# Patient Record
Sex: Female | Born: 1965
Health system: Southern US, Community
[De-identification: ages and names within clinical notes are randomized; demographics above are authoritative.]

## PROBLEM LIST (undated history)

## (undated) DIAGNOSIS — I10 Essential (primary) hypertension: Secondary | ICD-10-CM

## (undated) HISTORY — PX: TUBAL LIGATION: SHX77

## (undated) HISTORY — DX: Essential (primary) hypertension: I10

---

## 1985-01-20 HISTORY — PX: FRACTURE SURGERY: SHX138

## 1992-02-21 LAB — HM COLONOSCOPY: HM Colonoscopy: NORMAL

## 1993-01-20 HISTORY — PX: EYE SURGERY: SHX253

## 1996-01-21 HISTORY — PX: APPENDECTOMY: SHX54

## 2009-10-04 ENCOUNTER — Ambulatory Visit: Payer: Self-pay | Admitting: Internal Medicine

## 2009-10-04 ENCOUNTER — Telehealth: Payer: Self-pay | Admitting: Internal Medicine

## 2009-10-04 DIAGNOSIS — R634 Abnormal weight loss: Secondary | ICD-10-CM | POA: Insufficient documentation

## 2009-10-04 DIAGNOSIS — N760 Acute vaginitis: Secondary | ICD-10-CM | POA: Insufficient documentation

## 2009-10-04 DIAGNOSIS — B9689 Other specified bacterial agents as the cause of diseases classified elsewhere: Secondary | ICD-10-CM | POA: Insufficient documentation

## 2009-10-04 HISTORY — DX: Acute vaginitis: N76.0

## 2009-10-04 HISTORY — DX: Abnormal weight loss: R63.4

## 2009-10-04 HISTORY — DX: Other specified bacterial agents as the cause of diseases classified elsewhere: B96.89

## 2009-10-04 LAB — CONVERTED CEMR LAB
Alkaline Phosphatase: 63 units/L (ref 39–117)
BUN: 10 mg/dL (ref 6–23)
CO2: 27 meq/L (ref 19–32)
Chloride: 107 meq/L (ref 96–112)
Creatinine, Ser: 1.11 mg/dL (ref 0.40–1.20)
HCT: 40.7 % (ref 36.0–46.0)
Hemoglobin: 13.3 g/dL (ref 12.0–15.0)
Indirect Bilirubin: 0.6 mg/dL (ref 0.0–0.9)
LDL Cholesterol: 88 mg/dL (ref 0–99)
RDW: 13.8 % (ref 11.5–15.5)
Total Bilirubin: 0.8 mg/dL (ref 0.3–1.2)
Triglycerides: 46 mg/dL (ref <150)

## 2009-10-09 ENCOUNTER — Telehealth: Payer: Self-pay | Admitting: Internal Medicine

## 2009-10-09 ENCOUNTER — Encounter: Payer: Self-pay | Admitting: Internal Medicine

## 2009-10-16 LAB — CONVERTED CEMR LAB: Pap Smear: NORMAL

## 2009-10-17 ENCOUNTER — Ambulatory Visit: Payer: Self-pay | Admitting: Diagnostic Radiology

## 2009-10-17 ENCOUNTER — Ambulatory Visit (HOSPITAL_BASED_OUTPATIENT_CLINIC_OR_DEPARTMENT_OTHER): Admission: RE | Admit: 2009-10-17 | Discharge: 2009-10-17 | Payer: Self-pay | Admitting: Internal Medicine

## 2009-10-20 LAB — HM PAP SMEAR: HM Pap smear: NORMAL

## 2009-10-23 ENCOUNTER — Telehealth: Payer: Self-pay | Admitting: Internal Medicine

## 2009-11-01 ENCOUNTER — Encounter: Admission: RE | Admit: 2009-11-01 | Discharge: 2009-11-01 | Payer: Self-pay | Admitting: Internal Medicine

## 2009-11-01 LAB — HM MAMMOGRAPHY

## 2009-12-04 ENCOUNTER — Ambulatory Visit: Payer: Self-pay | Admitting: Internal Medicine

## 2010-02-19 NOTE — Letter (Signed)
   [place] at [facility] [address] [place], [place]  [address]  [place] Phone: [phone]      October 09, 2009   [facility] [address] [place], [place] [address]  RE:  LAB RESULTS  Dear  Ms. Tomassi,  The following is an interpretation of your most recent lab tests.  Please take note of any instructions provided or changes to medications that have resulted from your lab work.  ELECTROLYTES:  Good - no changes needed  KIDNEY FUNCTION TESTS:  Good - no changes needed  LIVER FUNCTION TESTS:  Good - no changes needed  LIPID PANEL:  Good - no changes needed Triglyceride: 46   Cholesterol: 159   LDL: 88   HDL: 62   Chol/HDL%:  2.6 Ratio  THYROID STUDIES:  Thyroid studies normal TSH: 1.963     CBC:  Good - no changes needed       Sincerely Yours,    Dr. Thomos Lemons  Appended Document:  mailed

## 2010-02-19 NOTE — Progress Notes (Signed)
Summary: Lab Results  Phone Note Outgoing Call   Summary of Call: call pt - HIV antibody test is negative.  I will send letter re:  other lab results Initial call taken by: D. Thomos Lemons DO,  October 09, 2009 5:34 PM  Follow-up for Phone Call        call placed to patient at [phone], she hsa been informed per Dr Artist Pais instructions Follow-up by: Glendell Docker CMA,  October 10, 2009 12:29 PM

## 2010-02-19 NOTE — Progress Notes (Signed)
Summary: record request faxed to Dr Pete Glatter   Phone Note Outgoing Call   Call placed by: Dr Pete Glatter [place]   Call placed to: yoo  Summary of Call: faxed record request to Dr Pete Glatter in [facility]    Initial call taken by: Roselle Locus,  October 04, 2009 2:21 PM

## 2010-02-19 NOTE — Progress Notes (Signed)
Summary: Follow up on Mammogram  Phone Note Outgoing Call   Summary of Call: plz make sure pt received informationa and follow up instruction re:  recent mammogram Initial call taken by: D. Thomos Lemons DO,  October 23, 2009 1:11 PM  Follow-up for Phone Call        call placed to patient at [phone], no answer, voice recording reached stated mailbox is full. Please try call again later Follow-up by: Glendell Docker CMA,  October 23, 2009 1:55 PM  Additional Follow-up for Phone Call Additional follow up Details #1::        patient returned phone call after she saw there was a missed call. she states she has been informed and had a follow up mammogram done  yesterday.  Additional Follow-up by: Glendell Docker CMA,  October 23, 2009 2:00 PM
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
## 2010-09-11 ENCOUNTER — Encounter: Payer: Self-pay | Admitting: Family

## 2010-09-13 ENCOUNTER — Other Ambulatory Visit: Payer: Self-pay | Admitting: Family

## 2010-09-13 ENCOUNTER — Encounter: Payer: Self-pay | Admitting: Family

## 2010-09-13 ENCOUNTER — Ambulatory Visit (INDEPENDENT_AMBULATORY_CARE_PROVIDER_SITE_OTHER): Payer: Managed Care, Other (non HMO) | Admitting: Family

## 2010-09-13 DIAGNOSIS — Z1231 Encounter for screening mammogram for malignant neoplasm of breast: Secondary | ICD-10-CM

## 2010-09-13 DIAGNOSIS — G43909 Migraine, unspecified, not intractable, without status migrainosus: Secondary | ICD-10-CM

## 2010-09-13 DIAGNOSIS — Z0000 Encounter for general adult medical examination without abnormal findings: Secondary | ICD-10-CM | POA: Insufficient documentation

## 2010-09-13 HISTORY — DX: Migraine, unspecified, not intractable, without status migrainosus: G43.909

## 2010-09-13 LAB — CBC WITH DIFFERENTIAL/PLATELET
Basophils Absolute: 0.1 10*3/uL (ref 0.0–0.1)
Basophils Relative: 1 % (ref 0–1)
MCHC: 33.2 g/dL (ref 30.0–36.0)
Neutro Abs: 3 10*3/uL (ref 1.7–7.7)
Neutrophils Relative %: 57 % (ref 43–77)
RDW: 14 % (ref 11.5–15.5)

## 2010-09-13 LAB — HEPATIC FUNCTION PANEL
ALT: 11 U/L (ref 0–35)
Albumin: 4.1 g/dL (ref 3.5–5.2)
Bilirubin, Direct: 0.2 mg/dL (ref 0.0–0.3)
Total Bilirubin: 0.7 mg/dL (ref 0.3–1.2)

## 2010-09-13 LAB — LIPID PANEL
Cholesterol: 151 mg/dL (ref 0–200)
Triglycerides: 35 mg/dL (ref <150)
VLDL: 7 mg/dL (ref 0–40)

## 2010-09-13 MED ORDER — SUMATRIPTAN SUCCINATE 50 MG PO TABS: ORAL_TABLET | ORAL | Status: DC

## 2010-09-13 NOTE — Assessment & Plan Note (Signed)
Deteriorated. Trial of Imitrex PRN.

## 2010-09-13 NOTE — Patient Instructions (Signed)
Please follow up in 2 months for your Mammogram/Pap smear. Try to eat regular healthy meals.

## 2010-09-13 NOTE — Assessment & Plan Note (Signed)
Pt was counseled on the importance of regular healthy meals.  Immunizations reviewed and up to date.  Pt wishes to have Pap and mammogram completed here.  She will follow up in October.  Counseled on SBE.
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
## 2010-09-14 LAB — BASIC METABOLIC PANEL WITH GFR
BUN: 12 mg/dL (ref 6–23)
Calcium: 9.5 mg/dL (ref 8.4–10.5)
GFR, Est African American: >60 mL/min (ref >60)
GFR, Est Non African American: >60 mL/min (ref >60)
Potassium: 4.3 mEq/L (ref 3.5–5.3)
Sodium: 139 mEq/L (ref 135–145)

## 2010-09-16 ENCOUNTER — Encounter: Payer: Self-pay | Admitting: Family

## 2010-11-20 ENCOUNTER — Ambulatory Visit (INDEPENDENT_AMBULATORY_CARE_PROVIDER_SITE_OTHER): Payer: Managed Care, Other (non HMO) | Admitting: Family

## 2010-11-20 ENCOUNTER — Encounter: Payer: Self-pay | Admitting: Family

## 2010-11-20 ENCOUNTER — Other Ambulatory Visit (HOSPITAL_COMMUNITY)
Admission: RE | Admit: 2010-11-20 | Discharge: 2010-11-20 | Disposition: A | Discharge status: Home / Self Care | Payer: Managed Care, Other (non HMO) | Source: Ambulatory Visit | Attending: Family | Admitting: Family

## 2010-11-20 ENCOUNTER — Ambulatory Visit (HOSPITAL_BASED_OUTPATIENT_CLINIC_OR_DEPARTMENT_OTHER)
Admission: RE | Admit: 2010-11-20 | Discharge: 2010-11-20 | Disposition: A | Discharge status: Home / Self Care | Payer: Managed Care, Other (non HMO) | Source: Ambulatory Visit | Attending: Family | Admitting: Family

## 2010-11-20 ENCOUNTER — Ambulatory Visit (INDEPENDENT_AMBULATORY_CARE_PROVIDER_SITE_OTHER)
Admission: RE | Admit: 2010-11-20 | Discharge: 2010-11-20 | Disposition: A | Discharge status: Home / Self Care | Payer: Managed Care, Other (non HMO) | Source: Ambulatory Visit | Attending: Family | Admitting: Family

## 2010-11-20 VITALS — BP 146/86 | HR 72 | Temp 98.1°F | Resp 16 | Ht 64.75 in | Wt 110.0 lb

## 2010-11-20 DIAGNOSIS — M25559 Pain in unspecified hip: Secondary | ICD-10-CM

## 2010-11-20 DIAGNOSIS — M25551 Pain in right hip: Secondary | ICD-10-CM

## 2010-11-20 DIAGNOSIS — N898 Other specified noninflammatory disorders of vagina: Secondary | ICD-10-CM

## 2010-11-20 DIAGNOSIS — Z01419 Encounter for gynecological examination (general) (routine) without abnormal findings: Secondary | ICD-10-CM

## 2010-11-20 DIAGNOSIS — Z1231 Encounter for screening mammogram for malignant neoplasm of breast: Secondary | ICD-10-CM

## 2010-11-20 NOTE — Patient Instructions (Signed)
Please complete your x-ray and mammogram on the first floor.  You may use Motrin 400mg  every 6 hours as needed for hip pain- (use sparingly). We will contact your with your results.
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
## 2010-11-21 ENCOUNTER — Telehealth: Payer: Self-pay | Admitting: Family

## 2010-11-21 LAB — WET PREP BY MOLECULAR PROBE: Gardnerella vaginalis: NEGATIVE

## 2010-11-21 NOTE — Telephone Encounter (Signed)
Please call patient and let her know that her wet prep is normal.  She should contact us if her symptoms worsen or if they do not improve.  Pap smear is still pending.

## 2010-11-22 NOTE — Telephone Encounter (Signed)
Pt.notified

## 2010-11-24 ENCOUNTER — Encounter: Payer: Self-pay | Admitting: Family

## 2010-11-24 DIAGNOSIS — M25551 Pain in right hip: Secondary | ICD-10-CM

## 2010-11-24 DIAGNOSIS — Z01419 Encounter for gynecological examination (general) (routine) without abnormal findings: Secondary | ICD-10-CM | POA: Insufficient documentation

## 2010-11-24 HISTORY — DX: Encounter for gynecological examination (general) (routine) without abnormal findings: Z01.419

## 2010-11-24 HISTORY — DX: Pain in right hip: M25.551

## 2010-11-24 NOTE — Assessment & Plan Note (Signed)
R hip normal on plain film. There is incidental note of a benign appearing lesion left hip.

## 2010-11-24 NOTE — Assessment & Plan Note (Addendum)
45 yr old female presents today for pap smear.  I recommended that she start a calcium supplement along with weight bearing exercise for bone health.  Mammogram today. Wet prep was also performed today and was negative.

## 2010-11-27 ENCOUNTER — Telehealth: Payer: Self-pay | Admitting: Family

## 2010-12-02 NOTE — Telephone Encounter (Signed)
Opened in error

## 2011-02-14 ENCOUNTER — Encounter: Payer: Self-pay | Admitting: Family

## 2011-02-14 ENCOUNTER — Ambulatory Visit (INDEPENDENT_AMBULATORY_CARE_PROVIDER_SITE_OTHER): Payer: Managed Care, Other (non HMO) | Admitting: Family

## 2011-02-14 DIAGNOSIS — N898 Other specified noninflammatory disorders of vagina: Secondary | ICD-10-CM | POA: Insufficient documentation

## 2011-02-14 DIAGNOSIS — R1900 Intra-abdominal and pelvic swelling, mass and lump, unspecified site: Secondary | ICD-10-CM

## 2011-02-14 HISTORY — DX: Intra-abdominal and pelvic swelling, mass and lump, unspecified site: R19.00

## 2011-02-14 NOTE — Patient Instructions (Signed)
Start monistat 7 tonight.  This is available OTC.   We will call you with your results.
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
## 2011-02-14 NOTE — Assessment & Plan Note (Signed)
Clinically looks like yeast.  Wet prep, GC/Chlamydia swabs performed today. Start [employer], await lab results.

## 2011-02-14 NOTE — Progress Notes (Signed)
Addended by: Mervin Kung A on: 02/14/2011 01:58 PM   Modules accepted: Orders

## 2011-02-14 NOTE — Assessment & Plan Note (Signed)
Sounds like she has a ventral hernia which reduces itself.  I discussed signs/symptoms of incarcerated hernia and need to go to ED if this occurs. Recommended CT abdomen for further evaluation followed by possible referral to surgeon pending results. She wishes to hold off on any further work up at this time.

## 2011-02-15 LAB — GC/CHLAMYDIA PROBE AMP, GENITAL: Chlamydia, DNA Probe: NEGATIVE

## 2011-02-15 LAB — WET PREP BY MOLECULAR PROBE
Candida species: NEGATIVE
Gardnerella vaginalis: POSITIVE — AB
Trichomonas vaginosis: NEGATIVE

## 2011-02-17 ENCOUNTER — Telehealth: Payer: Self-pay | Admitting: Family

## 2011-02-17 MED ORDER — METRONIDAZOLE 0.75 % VA GEL: 1.0000 | Freq: Two times a day (BID) | VAGINAL | Status: AC

## 2011-02-17 NOTE — Telephone Encounter (Signed)
Attempted to reach pt and left detailed message on voicemail re: instructions below and that other testing is normal. Advised pt to call if any questions.

## 2011-02-17 NOTE — Telephone Encounter (Signed)
Pls call pt and let her know that her wet prep shows bacteria. She should stop monistat, start metrogel.  Let me know if she has any nausea with the medicine, but hopefully she will not since it is more of a topical medicine.  She has had nausea with the tabs in the past.   Gonorrhea/chlamydia testing is negative.

## 2011-04-10 ENCOUNTER — Emergency Department (HOSPITAL_BASED_OUTPATIENT_CLINIC_OR_DEPARTMENT_OTHER)
Admission: EM | Admit: 2011-04-10 | Discharge: 2011-04-10 | Disposition: A | Discharge status: Home / Self Care | Payer: Managed Care, Other (non HMO) | Attending: Emergency Medicine | Admitting: Emergency Medicine

## 2011-04-10 ENCOUNTER — Other Ambulatory Visit: Payer: Self-pay

## 2011-04-10 ENCOUNTER — Telehealth: Payer: Self-pay | Admitting: Family

## 2011-04-10 ENCOUNTER — Emergency Department (INDEPENDENT_AMBULATORY_CARE_PROVIDER_SITE_OTHER): Payer: Managed Care, Other (non HMO)

## 2011-04-10 ENCOUNTER — Encounter (HOSPITAL_BASED_OUTPATIENT_CLINIC_OR_DEPARTMENT_OTHER): Payer: Self-pay | Admitting: *Deleted

## 2011-04-10 DIAGNOSIS — R079 Chest pain, unspecified: Secondary | ICD-10-CM

## 2011-04-10 DIAGNOSIS — R0789 Other chest pain: Secondary | ICD-10-CM | POA: Insufficient documentation

## 2011-04-10 LAB — TROPONIN I: Troponin I: <0.3 ng/mL (ref <0.30)

## 2011-04-10 MED ORDER — IBUPROFEN 800 MG PO TABS
800.0000 mg | ORAL_TABLET | Freq: Once | ORAL | Status: AC
  Administered 2011-04-10: 800 mg via ORAL
  Filled 2011-04-10: qty 1

## 2011-04-10 NOTE — Discharge Instructions (Signed)
Chest Pain, Nonspecific It is often hard to give a specific diagnosis for the cause of chest pain. There is always a chance that your pain could be related to something serious, like a heart attack or a blood clot in the lungs. You need to follow up with your caregiver for further evaluation. More lab tests or other studies such as X-rays, electrocardiography, stress testing, or cardiac imaging may be needed to find the cause of your pain. Most of the time, nonspecific chest pain improves within 2 to 3 days with rest and mild pain medicine. For the next few days, avoid physical exertion or activities that bring on pain. Do not smoke. Avoid drinking alcohol. Call your caregiver for routine follow-up as advised.  SEEK IMMEDIATE MEDICAL CARE IF:  You develop increased chest pain or pain that radiates to the arm, neck, jaw, back, or abdomen.   You develop shortness of breath, increased coughing, or you start coughing up blood.   You have severe back or abdominal pain, nausea, or vomiting.   You develop severe weakness, fainting, fever, or chills.  Document Released: 01/06/2005 Document Revised: 12/26/2010 Document Reviewed: 06/26/2006 [facility] Patient Information 2012 [place], [place].    Take Advil as directed for pain. Call [facility] Craze to be seen if not improved by next week . Return if your condition worsens for any reason

## 2011-04-10 NOTE — Telephone Encounter (Signed)
Patient called with Shortness of Breath, and left  breast pain,  Trisha @ lunch ,told patient if she thought she should go to ED ,she could without waiting to call back . Patient stated she was going.

## 2011-04-10 NOTE — ED Notes (Signed)
EKG completed and given to Dr. Lockwood. 
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
## 2011-04-10 NOTE — ED Notes (Signed)
Pt reports chest pain since last night- states she had been lifting boxes at work 3 days ago- reports pain worse with inspiration and movement

## 2011-04-11 NOTE — Telephone Encounter (Signed)
Spoke with pt re: ER visit yesterday. She reports that she was instructed to use OTC anti-inflammatories as she might have a pulled muscle and to follow up with [place] in 1 week. Pt states she feels some better today. Follow up scheduled for 04/16/11 at 2:30pm.

## 2011-04-11 NOTE — Telephone Encounter (Signed)
Attempted to reach pt to see how she is doing today. Left message for pt to return my call.

## 2011-04-16 ENCOUNTER — Ambulatory Visit: Payer: Managed Care, Other (non HMO) | Admitting: Family

## 2011-04-16 DIAGNOSIS — Z0289 Encounter for other administrative examinations: Secondary | ICD-10-CM

## 2011-05-30 ENCOUNTER — Encounter: Payer: Self-pay | Admitting: Family

## 2011-05-30 ENCOUNTER — Telehealth: Payer: Self-pay | Admitting: Internal Medicine

## 2011-05-30 ENCOUNTER — Ambulatory Visit (INDEPENDENT_AMBULATORY_CARE_PROVIDER_SITE_OTHER): Payer: Managed Care, Other (non HMO) | Admitting: Family

## 2011-05-30 VITALS — BP 110/80 | HR 81 | Temp 98.1°F | Resp 16 | Wt 112.1 lb

## 2011-05-30 DIAGNOSIS — N898 Other specified noninflammatory disorders of vagina: Secondary | ICD-10-CM

## 2011-05-30 DIAGNOSIS — N760 Acute vaginitis: Secondary | ICD-10-CM

## 2011-05-30 MED ORDER — CLINDAMYCIN HCL 300 MG PO CAPS: 300.0000 mg | ORAL_CAPSULE | Freq: Two times a day (BID) | ORAL | Status: AC

## 2011-05-30 NOTE — Telephone Encounter (Signed)
Since it has been 3 months since her last visit, I will need to see her back in the office and confirm that it is still BV and not a different type of infection.  We will then plan to treat accordingly.
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
## 2011-05-30 NOTE — Telephone Encounter (Signed)
Patient states that she is still having problems with the bacterial vaginosis?, since her last visit. She would like to talk to [facility] about what else she could do about this problem.

## 2011-05-30 NOTE — Assessment & Plan Note (Signed)
Wet prep performed today. Start empiric rx with clindamycin.

## 2011-05-30 NOTE — Patient Instructions (Signed)
Please call if symptoms worsen or if not resolved in 1 week.  

## 2011-05-30 NOTE — Telephone Encounter (Signed)
Notified pt and scheduled her an appt for today at 2:15pm.

## 2011-05-30 NOTE — Telephone Encounter (Signed)
Please advise 

## 2011-05-31 LAB — WET PREP BY MOLECULAR PROBE
Candida species: NEGATIVE
Gardnerella vaginalis: POSITIVE — AB
Trichomonas vaginosis: NEGATIVE

## 2011-12-10 ENCOUNTER — Encounter: Payer: Self-pay | Admitting: Family

## 2011-12-10 ENCOUNTER — Ambulatory Visit (INDEPENDENT_AMBULATORY_CARE_PROVIDER_SITE_OTHER): Payer: Managed Care, Other (non HMO) | Admitting: Family

## 2011-12-10 VITALS — BP 128/90 | HR 73 | Temp 98.0°F | Resp 16 | Ht 64.75 in | Wt 108.0 lb

## 2011-12-10 DIAGNOSIS — R519 Headache, unspecified: Secondary | ICD-10-CM | POA: Insufficient documentation

## 2011-12-10 DIAGNOSIS — R51 Headache: Secondary | ICD-10-CM

## 2011-12-10 HISTORY — DX: Headache: R51

## 2011-12-10 MED ORDER — KETOROLAC TROMETHAMINE 30 MG/ML IJ SOLN
30.0000 mg | Freq: Once | INTRAMUSCULAR | Status: AC
  Administered 2011-12-10: 30 mg via INTRAMUSCULAR

## 2011-12-10 NOTE — Patient Instructions (Addendum)
You can use imitrex as need for headache. Call if headache is not improved in 24 hours.

## 2011-12-10 NOTE — Assessment & Plan Note (Signed)
IM toradol in the office.  Recommended imitrex as needed and to call if no improvement with these measures.

## 2011-12-10 NOTE — Addendum Note (Signed)
Addended by: Mervin Kung A on: 12/10/2011 04:16 PM   Modules accepted: Orders
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
## 2011-12-17 ENCOUNTER — Other Ambulatory Visit: Payer: Self-pay | Admitting: *Deleted

## 2011-12-17 NOTE — Telephone Encounter (Signed)
Pt left message requesting refill of Clindamycin taken in the past for bacterial vaginosis. Attempted to reach pt and left message to return my call.

## 2011-12-23 NOTE — Telephone Encounter (Signed)
Pt left message requesting refill. Attempted to reach pt and left detailed message to call us back with current symptoms.

## 2011-12-24 MED ORDER — CLINDAMYCIN HCL 300 MG PO CAPS: 300.0000 mg | ORAL_CAPSULE | Freq: Two times a day (BID) | ORAL | Status: DC

## 2011-12-24 NOTE — Telephone Encounter (Signed)
Pt left message stating she is currently having a white vaginal discharge the same as when she had her bacterial infection. Pt states she is out of refills on clindamycin and would like more.  Please advise.

## 2011-12-24 NOTE — Telephone Encounter (Signed)
Clindamycin rx sent to pharmacy.  If no improvement with clindamycin needs OV for wet prep.

## 2011-12-24 NOTE — Telephone Encounter (Signed)
Notified pt and she voices understanding. 

## 2011-12-26 NOTE — Telephone Encounter (Signed)
Received message from pt that pharmacy did not receive our Rx for Clindamycin. Verified with Pam at [facility] pharmacy that rx is ready to be picked up. Attempted to reach pt and left message on voicemail that rx is ready for pick up at the [facility] and to call if any questions.

## 2012-01-01 ENCOUNTER — Telehealth: Payer: Self-pay | Admitting: Family

## 2012-01-01 NOTE — Telephone Encounter (Signed)
REFILL CLINDAMYCIN 300 MG CAP TAKE 1 CAPSULE BY MOUTH TWICE DAILY QTY 14 LAST FILL 12-25-2011

## 2012-01-01 NOTE — Telephone Encounter (Signed)
Spoke with pt, she states this is an old request and she picked up previous rx that we sent in. Does not need medication at this time.

## 2012-02-17 ENCOUNTER — Encounter: Payer: Self-pay | Admitting: Family

## 2012-02-17 ENCOUNTER — Ambulatory Visit (INDEPENDENT_AMBULATORY_CARE_PROVIDER_SITE_OTHER): Payer: Managed Care, Other (non HMO) | Admitting: Family

## 2012-02-17 VITALS — BP 130/82 | HR 68 | Temp 98.5°F | Resp 16 | Wt 108.1 lb

## 2012-02-17 DIAGNOSIS — M25519 Pain in unspecified shoulder: Secondary | ICD-10-CM

## 2012-02-17 DIAGNOSIS — A599 Trichomoniasis, unspecified: Secondary | ICD-10-CM

## 2012-02-17 DIAGNOSIS — A5901 Trichomonal vulvovaginitis: Secondary | ICD-10-CM

## 2012-02-17 DIAGNOSIS — N898 Other specified noninflammatory disorders of vagina: Secondary | ICD-10-CM

## 2012-02-17 DIAGNOSIS — M25511 Pain in right shoulder: Secondary | ICD-10-CM | POA: Insufficient documentation

## 2012-02-17 NOTE — Patient Instructions (Addendum)
Please let us know if shoulder pain worsens or if it does not continue to improve.  We will contact you with the results of your swab.
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
## 2012-02-18 ENCOUNTER — Other Ambulatory Visit: Payer: Self-pay | Admitting: Family

## 2012-02-18 DIAGNOSIS — M25511 Pain in right shoulder: Secondary | ICD-10-CM

## 2012-02-18 DIAGNOSIS — A599 Trichomoniasis, unspecified: Secondary | ICD-10-CM | POA: Insufficient documentation

## 2012-02-18 HISTORY — DX: Pain in right shoulder: M25.511

## 2012-02-18 LAB — WET PREP BY MOLECULAR PROBE
Candida species: NEGATIVE
Gardnerella vaginalis: NEGATIVE
Trichomonas vaginosis: POSITIVE — AB

## 2012-02-18 MED ORDER — TINIDAZOLE 500 MG PO TABS: 2.0000 g | ORAL_TABLET | Freq: Once | ORAL | Status: DC

## 2012-02-18 MED ORDER — ONDANSETRON HCL 4 MG PO TABS: ORAL_TABLET | ORAL | Status: DC

## 2012-02-18 NOTE — Assessment & Plan Note (Signed)
Improving.  Monitor. If symptoms worsen or if no improvement, consider referral to sports medicine.

## 2012-02-18 NOTE — Assessment & Plan Note (Signed)
Wet prep performed- shows trich.  Plan rx with tindamax.  See phone note.

## 2012-02-18 NOTE — Telephone Encounter (Signed)
Spoke with pt.  Discussed + trichomonas.  Recommended that she notify partner and that he be treated as well.  She has taken metronidazole in past- with some nausea.  Will try single dose Tindamax with zofran prior to dose to see if she can tolerated.  She verbalizes understanding.

## 2012-02-18 NOTE — Telephone Encounter (Signed)
Reviewed Wet prep- + trichomonas.

## 2012-04-07 ENCOUNTER — Encounter: Payer: Self-pay | Admitting: Family

## 2012-04-07 ENCOUNTER — Ambulatory Visit (INDEPENDENT_AMBULATORY_CARE_PROVIDER_SITE_OTHER): Payer: Managed Care, Other (non HMO) | Admitting: Family

## 2012-04-07 VITALS — BP 120/84 | HR 79 | Temp 98.2°F | Resp 16 | Wt 109.1 lb

## 2012-04-07 DIAGNOSIS — M25511 Pain in right shoulder: Secondary | ICD-10-CM

## 2012-04-07 DIAGNOSIS — M25519 Pain in unspecified shoulder: Secondary | ICD-10-CM
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
## 2012-04-07 NOTE — Patient Instructions (Addendum)
You will be contact about your referral to sports medicine.  Please let us know if you have not heard back within 1 week about your referral.

## 2012-04-08 NOTE — Assessment & Plan Note (Signed)
Deteriorated. Will refer to sports med for further evaluation.

## 2012-04-09 ENCOUNTER — Ambulatory Visit: Payer: Managed Care, Other (non HMO) | Admitting: Family Medicine

## 2012-04-12 ENCOUNTER — Ambulatory Visit (INDEPENDENT_AMBULATORY_CARE_PROVIDER_SITE_OTHER): Payer: Managed Care, Other (non HMO) | Admitting: Family Medicine

## 2012-04-12 ENCOUNTER — Encounter: Payer: Self-pay | Admitting: Family Medicine

## 2012-04-12 VITALS — BP 161/108 | HR 80 | Ht 66.0 in | Wt 107.0 lb

## 2012-04-12 DIAGNOSIS — M25511 Pain in right shoulder: Secondary | ICD-10-CM

## 2012-04-12 DIAGNOSIS — M25519 Pain in unspecified shoulder: Secondary | ICD-10-CM

## 2012-04-12 MED ORDER — MELOXICAM 15 MG PO TABS: 15.0000 mg | ORAL_TABLET | Freq: Every day | ORAL | Status: DC

## 2012-04-12 NOTE — Patient Instructions (Addendum)
You have rotator cuff impingement Try to avoid painful activities (overhead activities, lifting with extended arm) as much as possible. Meloxicam 15mg  daily with food for pain and inflammation (don't take aleve or ibuprofen with this). Ok to take tylenol in addition to this though if you need something else. Subacromial injection may be beneficial to help with pain and to decrease inflammation - would consider this in the future. Start physical therapy with transition to home exercise program. Do home exercise program with theraband and scapular stabilization exercises daily - these are very important for long term relief. If not improving at follow-up we will consider further imaging, injection, nitro patches. Follow up with me in 6 weeks for reevaluation.

## 2012-04-13 ENCOUNTER — Encounter: Payer: Self-pay | Admitting: Family Medicine
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
## 2012-04-13 NOTE — Assessment & Plan Note (Signed)
2/2 rotator cuff impingement.  Start with formal PT and home exercises.  meloxicam and tylenol as needed for pain.  Declined subacromial injection today.  Consider injection, nitro patches, further imaging if not improving as expected.

## 2012-04-14 ENCOUNTER — Ambulatory Visit: Payer: Managed Care, Other (non HMO) | Attending: Family Medicine | Admitting: Physical Therapy

## 2012-04-14 DIAGNOSIS — IMO0001 Reserved for inherently not codable concepts without codable children: Secondary | ICD-10-CM | POA: Insufficient documentation

## 2012-04-14 DIAGNOSIS — M25519 Pain in unspecified shoulder: Secondary | ICD-10-CM | POA: Insufficient documentation

## 2012-04-14 DIAGNOSIS — M25619 Stiffness of unspecified shoulder, not elsewhere classified: Secondary | ICD-10-CM | POA: Insufficient documentation

## 2012-04-20 ENCOUNTER — Ambulatory Visit: Payer: Managed Care, Other (non HMO) | Attending: Family Medicine | Admitting: Physical Therapy

## 2012-04-20 DIAGNOSIS — M25619 Stiffness of unspecified shoulder, not elsewhere classified: Secondary | ICD-10-CM | POA: Insufficient documentation

## 2012-04-20 DIAGNOSIS — M25519 Pain in unspecified shoulder: Secondary | ICD-10-CM | POA: Insufficient documentation

## 2012-04-20 DIAGNOSIS — IMO0001 Reserved for inherently not codable concepts without codable children: Secondary | ICD-10-CM | POA: Insufficient documentation

## 2012-04-22 ENCOUNTER — Ambulatory Visit: Payer: Managed Care, Other (non HMO) | Admitting: Physical Therapy

## 2012-04-27 ENCOUNTER — Ambulatory Visit: Payer: Managed Care, Other (non HMO) | Admitting: Physical Therapy

## 2012-04-27 ENCOUNTER — Other Ambulatory Visit: Payer: Self-pay | Admitting: Family

## 2012-04-27 DIAGNOSIS — Z1231 Encounter for screening mammogram for malignant neoplasm of breast: Secondary | ICD-10-CM

## 2012-04-28 ENCOUNTER — Ambulatory Visit (HOSPITAL_BASED_OUTPATIENT_CLINIC_OR_DEPARTMENT_OTHER)
Admission: RE | Admit: 2012-04-28 | Discharge: 2012-04-28 | Disposition: A | Discharge status: Home / Self Care | Payer: Managed Care, Other (non HMO) | Source: Ambulatory Visit | Attending: Family | Admitting: Family

## 2012-04-28 DIAGNOSIS — Z1231 Encounter for screening mammogram for malignant neoplasm of breast: Secondary | ICD-10-CM

## 2012-04-29 ENCOUNTER — Ambulatory Visit: Payer: Managed Care, Other (non HMO) | Admitting: Physical Therapy

## 2012-05-04 ENCOUNTER — Ambulatory Visit: Payer: Managed Care, Other (non HMO) | Admitting: Physical Therapy

## 2012-05-05 ENCOUNTER — Telehealth: Payer: Self-pay | Admitting: *Deleted

## 2012-05-05 NOTE — Telephone Encounter (Signed)
Received call from pt requesting recent mammogram results. Advised pt of normal results. Pt states she has a tender cyst on her breast and has had these in the past and usually has to have them drained. Scheduled pt appt on 05/11/12 at 3:15pm.

## 2012-05-06 ENCOUNTER — Ambulatory Visit: Payer: Managed Care, Other (non HMO) | Admitting: Physical Therapy

## 2012-05-11 ENCOUNTER — Ambulatory Visit: Payer: Managed Care, Other (non HMO) | Admitting: Physical Therapy

## 2012-05-11 ENCOUNTER — Ambulatory Visit: Payer: Managed Care, Other (non HMO) | Admitting: Family

## 2012-05-13 ENCOUNTER — Ambulatory Visit (HOSPITAL_BASED_OUTPATIENT_CLINIC_OR_DEPARTMENT_OTHER)
Admission: RE | Admit: 2012-05-13 | Discharge: 2012-05-13 | Disposition: A | Discharge status: Home / Self Care | Payer: Managed Care, Other (non HMO) | Source: Ambulatory Visit | Attending: Family Medicine | Admitting: Family Medicine

## 2012-05-13 ENCOUNTER — Encounter: Payer: Self-pay | Admitting: Family Medicine

## 2012-05-13 ENCOUNTER — Ambulatory Visit: Payer: Managed Care, Other (non HMO) | Admitting: Physical Therapy

## 2012-05-13 ENCOUNTER — Ambulatory Visit (INDEPENDENT_AMBULATORY_CARE_PROVIDER_SITE_OTHER): Payer: Managed Care, Other (non HMO) | Admitting: Family Medicine

## 2012-05-13 VITALS — BP 122/86 | HR 70 | Ht 67.0 in | Wt 110.0 lb

## 2012-05-13 DIAGNOSIS — M25511 Pain in right shoulder: Secondary | ICD-10-CM

## 2012-05-13 DIAGNOSIS — M25519 Pain in unspecified shoulder: Secondary | ICD-10-CM | POA: Insufficient documentation

## 2012-05-13 NOTE — Assessment & Plan Note (Signed)
Not improving as expected over past month with PT, meloxicam.  Radiographs today negative.  Will move forward with MRI to assess for rotator cuff tear.  If only tendinopathy, adhesive capsulitis will consider combination subacromial/glenohumeral injection, patches, HEP.

## 2012-05-13 NOTE — Patient Instructions (Addendum)
Go ahead with x-rays, MRI. We will call you with results and how to proceed.
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
## 2012-05-18 ENCOUNTER — Ambulatory Visit: Payer: Managed Care, Other (non HMO) | Admitting: Rehabilitation

## 2012-05-24 ENCOUNTER — Encounter: Payer: Self-pay | Admitting: Family

## 2012-05-24 ENCOUNTER — Ambulatory Visit: Payer: Managed Care, Other (non HMO) | Admitting: Family Medicine

## 2012-05-24 ENCOUNTER — Ambulatory Visit (INDEPENDENT_AMBULATORY_CARE_PROVIDER_SITE_OTHER): Payer: Managed Care, Other (non HMO) | Admitting: Family

## 2012-05-24 ENCOUNTER — Ambulatory Visit
Admission: RE | Admit: 2012-05-24 | Discharge: 2012-05-24 | Disposition: A | Discharge status: Home / Self Care | Payer: Managed Care, Other (non HMO) | Source: Ambulatory Visit | Attending: Family Medicine | Admitting: Family Medicine

## 2012-05-24 VITALS — BP 116/80 | HR 84 | Temp 98.3°F | Resp 16 | Wt 113.1 lb

## 2012-05-24 DIAGNOSIS — N6310 Unspecified lump in the right breast, unspecified quadrant: Secondary | ICD-10-CM

## 2012-05-24 DIAGNOSIS — N63 Unspecified lump in unspecified breast: Secondary | ICD-10-CM

## 2012-05-24 DIAGNOSIS — M25511 Pain in right shoulder: Secondary | ICD-10-CM

## 2012-05-24 NOTE — Patient Instructions (Addendum)
You will be contacted about your breast imaging.  Please let us know if you do not hear back about your appointment by the end of the week.

## 2012-05-24 NOTE — Progress Notes (Signed)
  Subjective:    Patient ID: Emily Casey, female    DOB: [birth year], 47 y.o.   MRN: [id number]  HPI  Emily Casey is a 47 yr old female who presents today for evaluation of a right breast "Cyst." first noticed 2 months ago. Reports that area is enlarging.  Had a mammogram last month which was normal.  Denies tenderness.   Review of Systems See HPI    Objective:   Physical Exam  Constitutional: She appears well-developed and well-nourished. No distress.  Cardiovascular: Normal rate and regular rhythm.   No murmur heard. Pulmonary/Chest: Effort normal and breath sounds normal. No respiratory distress. She has no wheezes. She has no rales. She exhibits no tenderness.  Genitourinary:  Approximately 2 inch wide mobile subareolar mass, no nipple discharge.  Bilateral breasts are noted to have cystic tissue but no other discrete masses are noted.           Assessment & Plan:

## 2012-05-25 ENCOUNTER — Ambulatory Visit: Payer: Managed Care, Other (non HMO) | Attending: Family Medicine | Admitting: Rehabilitation

## 2012-05-25 DIAGNOSIS — IMO0001 Reserved for inherently not codable concepts without codable children: Secondary | ICD-10-CM | POA: Insufficient documentation

## 2012-05-25 DIAGNOSIS — M25519 Pain in unspecified shoulder: Secondary | ICD-10-CM | POA: Insufficient documentation

## 2012-05-25 DIAGNOSIS — M25619 Stiffness of unspecified shoulder, not elsewhere classified: Secondary | ICD-10-CM | POA: Insufficient documentation

## 2012-05-26 MED ORDER — NITROGLYCERIN 0.2 MG/HR TD PT24: MEDICATED_PATCH | TRANSDERMAL | Status: DC

## 2012-05-26 NOTE — Addendum Note (Signed)
Addended by: Lenda Kelp on: 05/26/2012 04:50 PM   Modules accepted: Orders

## 2012-05-27 DIAGNOSIS — N6310 Unspecified lump in the right breast, unspecified quadrant: Secondary | ICD-10-CM

## 2012-05-27 HISTORY — DX: Unspecified lump in the right breast, unspecified quadrant: N63.10

## 2012-05-27 NOTE — Assessment & Plan Note (Signed)
Mass is large.  Will request diagnostic mammo for further evaluation.

## 2012-06-01 ENCOUNTER — Ambulatory Visit: Payer: Managed Care, Other (non HMO) | Admitting: Rehabilitation

## 2012-06-02 ENCOUNTER — Ambulatory Visit
Admission: RE | Admit: 2012-06-02 | Discharge: 2012-06-02 | Disposition: A | Discharge status: Home / Self Care | Payer: Managed Care, Other (non HMO) | Source: Ambulatory Visit | Attending: Family | Admitting: Family

## 2012-06-02 ENCOUNTER — Other Ambulatory Visit: Payer: Self-pay | Admitting: Family

## 2012-06-02 DIAGNOSIS — N6310 Unspecified lump in the right breast, unspecified quadrant: Secondary | ICD-10-CM

## 2012-06-08 ENCOUNTER — Ambulatory Visit: Payer: Managed Care, Other (non HMO) | Admitting: Physical Therapy

## 2012-06-15 ENCOUNTER — Ambulatory Visit: Payer: Managed Care, Other (non HMO) | Admitting: Rehabilitation

## 2012-06-21 ENCOUNTER — Telehealth: Payer: Self-pay | Admitting: *Deleted

## 2012-06-21 MED ORDER — METRONIDAZOLE 500 MG PO TABS: 500.0000 mg | ORAL_TABLET | Freq: Two times a day (BID) | ORAL | Status: DC

## 2012-06-21 NOTE — Telephone Encounter (Signed)
ER notes received and forwarded to Provider, please advise.

## 2012-06-21 NOTE — Telephone Encounter (Signed)
Received call from pt stating she was seen in the ER over the weekend for abdominal pain. Went to [facility] and reports negative std testing but doctor mentioned seeing some cells indicating bacterial vaginosis but states that he did not prescribe her anything. Pt states vaginal odor is getter stronger and wants to know if we can prescribe something. Advised her we will need to get ER records first. Records requested.

## 2012-06-21 NOTE — Telephone Encounter (Signed)
Records reviewed. rx sent for [facility] to [facility] to treat bacterial vaginosis.

## 2012-06-22 NOTE — Telephone Encounter (Signed)
Called and left message for pt to return call.  

## 2012-06-22 NOTE — Telephone Encounter (Signed)
Notified pt. 

## 2012-07-05 ENCOUNTER — Other Ambulatory Visit: Payer: Self-pay | Admitting: Family Medicine

## 2012-07-05 ENCOUNTER — Telehealth: Payer: Self-pay | Admitting: Family Medicine

## 2012-07-05 ENCOUNTER — Other Ambulatory Visit: Payer: Self-pay | Admitting: *Deleted

## 2012-07-05 DIAGNOSIS — M25511 Pain in right shoulder: Secondary | ICD-10-CM

## 2012-07-05 MED ORDER — MELOXICAM 15 MG PO TABS: 15.0000 mg | ORAL_TABLET | Freq: Every day | ORAL | Status: DC

## 2012-07-06 ENCOUNTER — Other Ambulatory Visit: Payer: Self-pay | Admitting: Family Medicine

## 2012-08-25 NOTE — Telephone Encounter (Signed)
Message opened in error

## 2012-10-15 ENCOUNTER — Ambulatory Visit: Payer: Managed Care, Other (non HMO) | Admitting: Family

## 2012-10-15 DIAGNOSIS — Z0289 Encounter for other administrative examinations: Secondary | ICD-10-CM

## 2012-10-29 ENCOUNTER — Ambulatory Visit (INDEPENDENT_AMBULATORY_CARE_PROVIDER_SITE_OTHER): Payer: Managed Care, Other (non HMO) | Admitting: Family

## 2012-10-29 ENCOUNTER — Encounter: Payer: Self-pay | Admitting: Family

## 2012-10-29 ENCOUNTER — Other Ambulatory Visit (HOSPITAL_COMMUNITY)
Admission: RE | Admit: 2012-10-29 | Discharge: 2012-10-29 | Disposition: A | Discharge status: Home / Self Care | Payer: Managed Care, Other (non HMO) | Source: Ambulatory Visit | Attending: Family | Admitting: Family

## 2012-10-29 VITALS — BP 130/80 | HR 71 | Temp 98.2°F | Resp 16 | Ht 64.75 in | Wt 112.1 lb

## 2012-10-29 DIAGNOSIS — L0291 Cutaneous abscess, unspecified: Secondary | ICD-10-CM | POA: Insufficient documentation

## 2012-10-29 DIAGNOSIS — Z113 Encounter for screening for infections with a predominantly sexual mode of transmission: Secondary | ICD-10-CM | POA: Insufficient documentation

## 2012-10-29 DIAGNOSIS — N760 Acute vaginitis: Secondary | ICD-10-CM

## 2012-10-29 DIAGNOSIS — Z01419 Encounter for gynecological examination (general) (routine) without abnormal findings: Secondary | ICD-10-CM | POA: Insufficient documentation

## 2012-10-29 DIAGNOSIS — Z1151 Encounter for screening for human papillomavirus (HPV): Secondary | ICD-10-CM | POA: Insufficient documentation

## 2012-10-29 DIAGNOSIS — N898 Other specified noninflammatory disorders of vagina: Secondary | ICD-10-CM

## 2012-10-29 MED ORDER — DOXYCYCLINE HYCLATE 100 MG PO TABS: 100.0000 mg | ORAL_TABLET | Freq: Two times a day (BID) | ORAL | Status: DC

## 2012-10-29 NOTE — Assessment & Plan Note (Signed)
Wet prep, GC/Chlamydia, Pap performed today. Will plan for additional serum testing HIV, RPR, HSV in 1 week at her follow up per pt request.

## 2012-10-29 NOTE — Patient Instructions (Signed)
Start doxycycline- antibiotic for the boil. This has been sent to your pharmacy. Take warm tub soaks or apply warm compresses to the boil twice daily. Call if boil increases in size or if it does not continue to improve.  We will let you know how your vaginal swabs turn out. Follow up in 1 week.
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
## 2012-10-29 NOTE — Assessment & Plan Note (Signed)
Will rx with doxycycline.  Likely started from a folliculitis. Per pt this is improving.  No indication for I and D at this time.  Recommended warm compresses and warm tub soaks to promote drainage.  Pt instructed to call if increased pain, swelling, or if symptoms to not continue to improve.

## 2012-10-30 LAB — WET PREP BY MOLECULAR PROBE: Gardnerella vaginalis: POSITIVE — AB

## 2012-11-01 ENCOUNTER — Telehealth: Payer: Self-pay | Admitting: Family

## 2012-11-01 MED ORDER — METRONIDAZOLE 0.75 % VA GEL: 1.0000 | Freq: Two times a day (BID) | VAGINAL | Status: DC

## 2012-11-01 NOTE — Telephone Encounter (Signed)
Please call pt and let her know that wet prep shows BV. Rx sent for metrogel to her pharmacy.

## 2012-11-01 NOTE — Telephone Encounter (Signed)
Notified pt and she voices understanding. 

## 2014-01-30 ENCOUNTER — Ambulatory Visit: Payer: Managed Care, Other (non HMO) | Admitting: Family

## 2014-02-03 ENCOUNTER — Ambulatory Visit (INDEPENDENT_AMBULATORY_CARE_PROVIDER_SITE_OTHER): Payer: Managed Care, Other (non HMO) | Admitting: Family

## 2014-02-03 ENCOUNTER — Encounter: Payer: Self-pay | Admitting: Family

## 2014-02-03 ENCOUNTER — Other Ambulatory Visit (HOSPITAL_COMMUNITY)
Admission: RE | Admit: 2014-02-03 | Discharge: 2014-02-03 | Disposition: A | Discharge status: Home / Self Care | Payer: Managed Care, Other (non HMO) | Source: Ambulatory Visit | Attending: Family | Admitting: Family

## 2014-02-03 VITALS — BP 120/70 | HR 71 | Temp 98.4°F | Resp 16 | Ht 64.75 in | Wt 111.4 lb

## 2014-02-03 DIAGNOSIS — N760 Acute vaginitis: Secondary | ICD-10-CM

## 2014-02-03 DIAGNOSIS — R102 Pelvic and perineal pain: Secondary | ICD-10-CM

## 2014-02-03 MED ORDER — ALBUTEROL SULFATE HFA 108 (90 BASE) MCG/ACT IN AERS: 2.0000 | INHALATION_SPRAY | Freq: Four times a day (QID) | RESPIRATORY_TRACT | Status: DC | PRN

## 2014-02-03 NOTE — Progress Notes (Signed)
Pre visit review using our clinic review tool, if applicable. No additional management support is needed unless otherwise documented below in the visit note. 

## 2014-02-03 NOTE — Patient Instructions (Signed)
Start albuterol 2 puffs every 6 hours as needed for wheezing. We will contact you with your results.
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
## 2014-02-04 DIAGNOSIS — N760 Acute vaginitis: Secondary | ICD-10-CM | POA: Insufficient documentation

## 2014-02-04 HISTORY — DX: Acute vaginitis: N76.0

## 2014-02-04 LAB — URINALYSIS, ROUTINE W REFLEX MICROSCOPIC
Bilirubin Urine: NEGATIVE
Glucose, UA: NEGATIVE mg/dL
Hgb urine dipstick: NEGATIVE
Ketones, ur: NEGATIVE mg/dL
LEUKOCYTES UA: NEGATIVE
Nitrite: NEGATIVE
PROTEIN: NEGATIVE mg/dL
Specific Gravity, Urine: 1.023 (ref 1.005–1.030)
Urobilinogen, UA: 1 mg/dL (ref 0.0–1.0)
pH: 6.5 (ref 5.0–8.0)

## 2014-02-04 LAB — GC/CHLAMYDIA PROBE AMP, URINE
Chlamydia, Swab/Urine, PCR: NEGATIVE
GC PROBE AMP, URINE: NEGATIVE

## 2014-02-04 NOTE — Assessment & Plan Note (Signed)
Obtain wet prep, urine culture, urine GC/Chlamydia.  Await results for further recommmendations.

## 2014-02-05 LAB — URINE CULTURE
COLONY COUNT: NO GROWTH
ORGANISM ID, BACTERIA: NO GROWTH

## 2014-02-07 ENCOUNTER — Telehealth: Payer: Self-pay | Admitting: Family

## 2014-02-07 LAB — CERVICOVAGINAL ANCILLARY ONLY
Wet Prep (BD Affirm): NEGATIVE
Wet Prep (BD Affirm): NEGATIVE
Wet Prep (BD Affirm): POSITIVE — AB

## 2014-02-07 MED ORDER — METRONIDAZOLE 0.75 % VA GEL: 1.0000 | Freq: Every day | VAGINAL | Status: DC

## 2014-02-07 NOTE — Telephone Encounter (Signed)
Testing notes bacterial vaginosis. Since she can't tolerate PO flagyl, will rx with metrogel.

## 2014-04-20 ENCOUNTER — Other Ambulatory Visit: Payer: Self-pay | Admitting: Family

## 2014-04-20 DIAGNOSIS — Z1231 Encounter for screening mammogram for malignant neoplasm of breast: Secondary | ICD-10-CM

## 2014-04-25 ENCOUNTER — Ambulatory Visit (HOSPITAL_BASED_OUTPATIENT_CLINIC_OR_DEPARTMENT_OTHER)
Admission: RE | Admit: 2014-04-25 | Discharge: 2014-04-25 | Disposition: A | Discharge status: Home / Self Care | Payer: Managed Care, Other (non HMO) | Source: Ambulatory Visit | Attending: Family | Admitting: Family

## 2014-04-25 DIAGNOSIS — R928 Other abnormal and inconclusive findings on diagnostic imaging of breast: Secondary | ICD-10-CM | POA: Diagnosis not present

## 2014-04-25 DIAGNOSIS — Z1231 Encounter for screening mammogram for malignant neoplasm of breast: Secondary | ICD-10-CM | POA: Diagnosis present

## 2014-04-27 ENCOUNTER — Other Ambulatory Visit: Payer: Self-pay | Admitting: Family

## 2014-04-27 DIAGNOSIS — R928 Other abnormal and inconclusive findings on diagnostic imaging of breast: Secondary | ICD-10-CM

## 2014-05-02 ENCOUNTER — Ambulatory Visit
Admission: RE | Admit: 2014-05-02 | Discharge: 2014-05-02 | Disposition: A | Discharge status: Home / Self Care | Payer: Managed Care, Other (non HMO) | Source: Ambulatory Visit | Attending: Family | Admitting: Family

## 2014-05-02 DIAGNOSIS — R928 Other abnormal and inconclusive findings on diagnostic imaging of breast: Secondary | ICD-10-CM

## 2014-10-19 ENCOUNTER — Ambulatory Visit: Payer: Managed Care, Other (non HMO) | Admitting: Internal Medicine

## 2014-11-06 ENCOUNTER — Telehealth: Payer: Self-pay | Admitting: Family

## 2014-11-06 NOTE — Telephone Encounter (Signed)
Noted  

## 2014-11-06 NOTE — Telephone Encounter (Signed)
Pt called for ER follow up. Went to [facility] 11/05/14 for dizziness. They did CT and chest xrays but told her they didn't find anything and pt was sent home. Scheduled pt for 11/07/14 2:00pm.

## 2014-11-07 ENCOUNTER — Emergency Department (HOSPITAL_BASED_OUTPATIENT_CLINIC_OR_DEPARTMENT_OTHER)
Admission: EM | Admit: 2014-11-07 | Discharge: 2014-11-07 | Disposition: A | Discharge status: Home / Self Care | Payer: Managed Care, Other (non HMO) | Attending: Emergency Medicine | Admitting: Emergency Medicine

## 2014-11-07 ENCOUNTER — Encounter (HOSPITAL_BASED_OUTPATIENT_CLINIC_OR_DEPARTMENT_OTHER): Payer: Self-pay | Admitting: Emergency Medicine

## 2014-11-07 ENCOUNTER — Ambulatory Visit: Payer: Managed Care, Other (non HMO) | Admitting: Family

## 2014-11-07 ENCOUNTER — Telehealth: Payer: Self-pay | Admitting: Family

## 2014-11-07 DIAGNOSIS — T783XXA Angioneurotic edema, initial encounter: Secondary | ICD-10-CM

## 2014-11-07 DIAGNOSIS — T782XXA Anaphylactic shock, unspecified, initial encounter: Secondary | ICD-10-CM | POA: Diagnosis not present

## 2014-11-07 DIAGNOSIS — Y998 Other external cause status: Secondary | ICD-10-CM | POA: Diagnosis not present

## 2014-11-07 DIAGNOSIS — Z87828 Personal history of other (healed) physical injury and trauma: Secondary | ICD-10-CM | POA: Insufficient documentation

## 2014-11-07 DIAGNOSIS — Y9389 Activity, other specified: Secondary | ICD-10-CM | POA: Diagnosis not present

## 2014-11-07 DIAGNOSIS — Y9289 Other specified places as the place of occurrence of the external cause: Secondary | ICD-10-CM | POA: Diagnosis not present

## 2014-11-07 DIAGNOSIS — Z8679 Personal history of other diseases of the circulatory system: Secondary | ICD-10-CM | POA: Diagnosis not present

## 2014-11-07 DIAGNOSIS — X58XXXA Exposure to other specified factors, initial encounter: Secondary | ICD-10-CM | POA: Insufficient documentation

## 2014-11-07 DIAGNOSIS — T7840XA Allergy, unspecified, initial encounter: Secondary | ICD-10-CM

## 2014-11-07 DIAGNOSIS — R21 Rash and other nonspecific skin eruption: Secondary | ICD-10-CM | POA: Diagnosis present

## 2014-11-07 MED ORDER — FAMOTIDINE IN NACL 20-0.9 MG/50ML-% IV SOLN
20.0000 mg | Freq: Once | INTRAVENOUS | Status: AC
  Administered 2014-11-07: 20 mg via INTRAVENOUS
  Filled 2014-11-07: qty 50

## 2014-11-07 MED ORDER — EPINEPHRINE 0.3 MG/0.3ML IJ SOAJ
0.3000 mg | Freq: Once | INTRAMUSCULAR | Status: DC | PRN
  Administered 2014-11-07: 0.3 mg via INTRAMUSCULAR
  Filled 2014-11-07: qty 0.3

## 2014-11-07 MED ORDER — METHYLPREDNISOLONE SODIUM SUCC 125 MG IJ SOLR
125.0000 mg | Freq: Once | INTRAMUSCULAR | Status: AC
  Administered 2014-11-07: 125 mg via INTRAVENOUS
  Filled 2014-11-07: qty 2

## 2014-11-07 MED ORDER — DIPHENHYDRAMINE HCL 50 MG/ML IJ SOLN
25.0000 mg | Freq: Once | INTRAMUSCULAR | Status: AC
Start: 2014-11-07 — End: 2014-11-07
  Administered 2014-11-07: 25 mg via INTRAVENOUS
  Filled 2014-11-07: qty 1

## 2014-11-07 MED ORDER — EPINEPHRINE 0.3 MG/0.3ML IJ SOAJ
0.3000 mg | Freq: Once | INTRAMUSCULAR | Status: AC
  Administered 2014-11-07: 0.3 mg via INTRAMUSCULAR
  Filled 2014-11-07: qty 0.3

## 2014-11-07 NOTE — Telephone Encounter (Signed)
No charge. 

## 2014-11-07 NOTE — Telephone Encounter (Signed)
Patient called and stated [facility] is currently in the ED [facility] for 11/15/2014 ED follow up - charge or no charge
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
## 2014-11-07 NOTE — ED Notes (Signed)
C/o rash since Sunday-[facility]nisone and [facility]ac[facility]

## 2014-11-07 NOTE — Discharge Instructions (Signed)
Continue taking the medications prescribed by [facility]. Follow-up with your primary care physician and the allergist.  Angioedema Angioedema is a sudden swelling of tissues, often of the skin. It can occur on the face or genitals or in the abdomen or other body parts. The swelling usually develops over a short period and gets better in 24 to 48 hours. It often begins during the night and is found when the person wakes up. The person may also get red, itchy patches of skin (hives). Angioedema can be dangerous if it involves swelling of the air passages.  Depending on the cause, episodes of angioedema may only happen once, come back in unpredictable patterns, or repeat for several years and then gradually fade away.  CAUSES  Angioedema can be caused by an allergic reaction to various triggers. It can also result from nonallergic causes, including reactions to drugs, immune system disorders, viral infections, or an abnormal gene that is passed to you from your parents (hereditary). For some people with angioedema, the cause is unknown.  Some things that can trigger angioedema include:   Foods.   Medicines, such as ACE inhibitors, ARBs, nonsteroidal anti-inflammatory agents, or estrogen.   Latex.   Animal saliva.   Insect stings.   Dyes used in X-rays.   Mild injury.   Dental work.  Surgery.  Stress.   Sudden changes in temperature.   Exercise. SIGNS AND SYMPTOMS   Swelling of the skin.  Hives. If these are present, there is also intense itching.  Redness in the affected area.   Pain in the affected area.  Swollen lips or tongue.  Breathing problems. This may happen if the air passages swell.  Wheezing. If internal organs are involved, there may be:   Nausea.   Abdominal pain.   Vomiting.   Difficulty swallowing.   Difficulty passing urine. DIAGNOSIS   Your health care provider will examine the affected area and take a medical  and family history.  Various tests may be done to help determine the cause. Tests may include:  Allergy skin tests to see if the problem is an allergic reaction.   Blood tests to check for hereditary angioedema.   Tests to check for underlying diseases that could cause the condition.   A review of your medicines, including over-the-counter medicines, may be done. TREATMENT  Treatment will depend on the cause of the angioedema. Possible treatments include:   Removal of anything that triggered the condition (such as stopping certain medicines).   Medicines to treat symptoms or prevent attacks. Medicines given may include:   Antihistamines.   Epinephrine injection.   Steroids.   Hospitalization may be required for severe attacks. If the air passages are affected, it can be an emergency. Tubes may need to be placed to keep the airway open. HOME CARE INSTRUCTIONS   Take all medicines as directed by your health care provider.  If you were given medicines for emergency allergy treatment, always carry them with you.  Wear a medical bracelet as directed by your health care provider.   Avoid known triggers. SEEK MEDICAL CARE IF:   You have repeat attacks of angioedema.   Your attacks are more frequent or more severe despite preventive measures.   You have hereditary angioedema and are considering having children. It is important to discuss with your health care provider the risks of passing the condition on to your children. SEEK IMMEDIATE MEDICAL CARE IF:   You have severe swelling of the  mouth, tongue, or lips.  You have difficulty breathing.   You have difficulty swallowing.   You faint. MAKE SURE YOU:  Understand these instructions.  Will watch your condition.  Will get help right away if you are not doing well or get worse.   This information is not intended to replace advice given to you by your health care provider. Make sure you discuss any  questions you have with your health care provider.   Document Released: 03/17/2001 Document Revised: 01/27/2014 Document Reviewed: 08/30/2012 Elsevier Interactive Patient Education 2016 [place] An allergy is an abnormal reaction to a substance by the body's defense system (immune system). Allergies can develop at any age. WHAT CAUSES ALLERGIES? An allergic reaction happens when the immune system mistakenly reacts to a normally harmless substance, called an allergen, as if it were harmful. The immune system releases antibodies to fight the substance. Antibodies eventually release a chemical called histamine into the bloodstream. The release of histamine is meant to protect the body from infection, but it also causes discomfort. An allergic reaction can be triggered by:  Eating an allergen.  Inhaling an allergen.  Touching an allergen. WHAT TYPES OF ALLERGIES ARE THERE? There are many types of allergies. Common types include:  Seasonal allergies. People with this type of allergy are usually allergic to substances that are only present during certain seasons, such as molds and pollens.  Food allergies.  Drug allergies.  Insect allergies.  Animal dander allergies. WHAT ARE SYMPTOMS OF ALLERGIES? Possible allergy symptoms include:  Swelling of the lips, face, tongue, mouth, or throat.  Sneezing, coughing, or wheezing.  Nasal congestion.  Tingling in the mouth.  Rash.  Itching.  Itchy, red, swollen areas of skin (hives).  Watery eyes.  Vomiting.  Diarrhea.  Dizziness.  Lightheadedness.  Fainting.  Trouble breathing or swallowing.  Chest tightness.  Rapid heartbeat. HOW ARE ALLERGIES DIAGNOSED? Allergies are diagnosed with a medical and family history and one or more of the following:  Skin tests.  Blood tests.  A food diary. A food diary is a record of all the foods and drinks you have in a day and of all the symptoms you  experience.  The results of an elimination diet. An elimination diet involves eliminating foods from your diet and then adding them back in one by one to find out if a certain food causes an allergic reaction. HOW ARE ALLERGIES TREATED? There is no cure for allergies, but allergic reactions can be treated with medicine. Severe reactions usually need to be treated at a hospital. HOW CAN REACTIONS BE PREVENTED? The best way to prevent an allergic reaction is by avoiding the substance you are allergic to. Allergy shots and medicines can also help prevent reactions in some cases. People with severe allergic reactions may be able to prevent a life-threatening reaction called anaphylaxis with a medicine given right after exposure to the allergen.   This information is not intended to replace advice given to you by your health care provider. Make sure you discuss any questions you have with your health care provider.   Document Released: 04/01/2002 Document Revised: 01/27/2014 Document Reviewed: 10/18/2013 Elsevier Interactive Patient Education 2016 [place]. Anaphylactic Reaction An anaphylactic reaction is a sudden, severe allergic reaction that involves the whole body. It can be life threatening. A hospital stay is often required. People with asthma, eczema, or hay fever are slightly more likely to have an anaphylactic reaction. CAUSES  An anaphylactic reaction may be  caused by anything to which you are allergic. After being exposed to the allergic substance, your immune system becomes sensitized to it. When you are exposed to that allergic substance again, an allergic reaction can occur. Common causes of an anaphylactic reaction include:  Medicines.  Foods, especially peanuts, wheat, shellfish, milk, and eggs.  Insect bites or stings.  Blood products.  Chemicals, such as dyes, latex, and contrast material used for imaging tests. SYMPTOMS  When an allergic reaction occurs, the body  releases histamine and other substances. These substances cause symptoms such as tightening of the airway. Symptoms often develop within seconds or minutes of exposure. Symptoms may include:  Skin rash or hives.  Itching.  Chest tightness.  Swelling of the eyes, tongue, or lips.  Trouble breathing or swallowing.  Lightheadedness or fainting.  Anxiety or confusion.  Stomach pains, vomiting, or diarrhea.  Nasal congestion.  A fast or irregular heartbeat (palpitations). DIAGNOSIS  Diagnosis is based on your history of recent exposure to allergic substances, your symptoms, and a physical exam. Your caregiver may also perform blood or urine tests to confirm the diagnosis. TREATMENT  Epinephrine medicine is the main treatment for an anaphylactic reaction. Other medicines that may be used for treatment include antihistamines, steroids, and albuterol. In severe cases, fluids and medicine to support blood pressure may be given through an intravenous line (IV). Even if you improve after treatment, you need to be observed to make sure your condition does not get worse. This may require a stay in the hospital. [place] a medical alert bracelet or necklace stating your allergy.  You and your family must learn how to use an anaphylaxis kit or give an epinephrine injection to temporarily treat an emergency allergic reaction. Always carry your epinephrine injection or anaphylaxis kit with you. This can be lifesaving if you have a severe reaction.  Do not drive or perform tasks after treatment until the medicines used to treat your reaction have worn off, or until your caregiver says it is okay.  If you have hives or a rash:  Take medicines as directed by your caregiver.  You may use an over-the-counter antihistamine (diphenhydramine) as needed.  Apply cold compresses to the skin or take baths in cool water. Avoid hot baths or showers. SEEK MEDICAL CARE IF:   You develop  symptoms of an allergic reaction to a new substance. Symptoms may start right away or minutes later.  You develop a rash, hives, or itching.  You develop new symptoms. SEEK IMMEDIATE MEDICAL CARE IF:   You have swelling of the mouth, difficulty breathing, or wheezing.  You have a tight feeling in your chest or throat.  You develop hives, swelling, or itching all over your body.  You develop severe vomiting or diarrhea.  You feel faint or pass out. This is an emergency. Use your epinephrine injection or anaphylaxis kit as you have been instructed. Call your local emergency services (911 in [place] Even if you improve after the injection, you need to be examined at a hospital emergency department. MAKE SURE YOU:   Understand these instructions.  Will watch your condition.  Will get help right away if you are not doing well or get worse.   This information is not intended to replace advice given to you by your health care provider. Make sure you discuss any questions you have with your health care provider.   Document Released: 01/06/2005 Document Revised: 01/11/2013 Document Reviewed: 07/19/2014 Elsevier Interactive  Patient Education 2016 [employer].

## 2014-11-07 NOTE — ED Provider Notes (Signed)
Care assumed from Dr. Anitra LauthPlunkett at shift change. Pt with allergic reaction, received Epi with improvement. Will need to watch until 4:30 PM to ensure she continues to improve. Will d/c home with Epipen.  4:35 PM Pt resting comfortably. Reports continued improvement. No resp distress, no further facial swelling. Advised f/u with PCP within 1-2 days along with allergist f/u. Stable for d/c. Return precautions given. Pt/family/caregiver aware medical decision making process and agreeable with plan.   Kathrynn SpeedRobyn M Hakeem Frazzini, PA-C 11/07/14 1636  Gwyneth SproutWhitney Plunkett, MD 11/08/14 [phone]

## 2014-11-08 NOTE — ED Notes (Signed)
Addendum:  Epi pen given to pt at discharge to take home for home use if needed for allergic reaction given to pt by Ailene Ravelobin Hess PA and instructions on use given by Ailene Ravelobin Hess PA.

## 2014-11-13 ENCOUNTER — Ambulatory Visit: Payer: Managed Care, Other (non HMO) | Admitting: Pediatrics

## 2014-11-15 ENCOUNTER — Ambulatory Visit: Payer: Managed Care, Other (non HMO) | Admitting: Family

## 2014-11-21 ENCOUNTER — Ambulatory Visit (INDEPENDENT_AMBULATORY_CARE_PROVIDER_SITE_OTHER): Payer: Managed Care, Other (non HMO) | Admitting: Family

## 2014-11-21 ENCOUNTER — Encounter: Payer: Self-pay | Admitting: Family

## 2014-11-21 VITALS — BP 131/79 | HR 71 | Temp 98.4°F | Resp 16 | Ht 64.75 in | Wt 111.6 lb

## 2014-11-21 DIAGNOSIS — Z23 Encounter for immunization: Secondary | ICD-10-CM | POA: Diagnosis not present

## 2014-11-21 DIAGNOSIS — R739 Hyperglycemia, unspecified: Secondary | ICD-10-CM | POA: Diagnosis not present

## 2014-11-21 DIAGNOSIS — T783XXA Angioneurotic edema, initial encounter: Secondary | ICD-10-CM

## 2014-11-21 DIAGNOSIS — Z87898 Personal history of other specified conditions: Secondary | ICD-10-CM | POA: Insufficient documentation

## 2014-11-21 HISTORY — DX: Angioneurotic edema, initial encounter: T78.3XXA

## 2014-11-21 NOTE — Patient Instructions (Signed)
Please complete lab work prior to leaving. Keep upcoming appointment with allergist. Keep liquid benadryl and epipen with you at all times.  If you have tongue/lip swelling or shortness of breath adminster epipen and liquid benadryl 50mg  and then call 911.

## 2014-11-21 NOTE — Progress Notes (Signed)
Pre visit review using our clinic review tool, if applicable. No additional management support is needed unless otherwise documented below in the visit note. 
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
## 2014-11-21 NOTE — Assessment & Plan Note (Signed)
Resolved.   Keep upcoming appointment with allergist. Keep liquid benadryl and epipen with you at all times.  If you have tongue/lip swelling or shortness of breath adminster epipen and liquid benadryl 50mg  and then call 911.

## 2014-11-22 ENCOUNTER — Encounter: Payer: Self-pay | Admitting: Family

## 2014-11-22 LAB — HEMOGLOBIN A1C: HEMOGLOBIN A1C: 5.8 % (ref 4.6–6.5)

## 2014-12-21 ENCOUNTER — Encounter: Payer: Self-pay | Admitting: Family Medicine

## 2014-12-21 ENCOUNTER — Ambulatory Visit (INDEPENDENT_AMBULATORY_CARE_PROVIDER_SITE_OTHER): Payer: Managed Care, Other (non HMO) | Admitting: Family Medicine

## 2014-12-21 ENCOUNTER — Other Ambulatory Visit (HOSPITAL_COMMUNITY)
Admission: RE | Admit: 2014-12-21 | Discharge: 2014-12-21 | Disposition: A | Discharge status: Home / Self Care | Payer: Managed Care, Other (non HMO) | Source: Ambulatory Visit | Attending: Family Medicine | Admitting: Family Medicine

## 2014-12-21 VITALS — BP 122/78 | HR 73 | Temp 98.1°F | Resp 16 | Ht 65.0 in | Wt 113.0 lb

## 2014-12-21 DIAGNOSIS — N760 Acute vaginitis: Secondary | ICD-10-CM

## 2014-12-21 NOTE — Patient Instructions (Signed)
Follow up as needed We'll notify you of your lab results and treat if needed Call with any questions or concerns Happy Holidays!! 

## 2014-12-21 NOTE — Progress Notes (Signed)
   Subjective:    Patient ID: Emily Casey, female    DOB: May 23, 1965, 49 y.o.   MRN: [id number]  HPI Vaginal d/c- pt had vaginal bleeding on Sunday, 'real light' but followed by 'real bad discharge'.  Pt reports foul smelling odor.  Used tampons on Sunday- no possibility of retained product.  D/c is thin and watery.  Pt has hx of BV.  No fevers.  No new sexual partners.  No vaginal burn or pain.   Review of Systems For ROS see HPI     Objective:   Physical Exam  Constitutional: She is oriented to person, place, and time. She appears well-developed and well-nourished. No distress.  HENT:  Head: Normocephalic and atraumatic.  Genitourinary: Vagina normal. No vaginal discharge found.  Neurological: She is alert and oriented to person, place, and time.  Skin: Skin is warm and dry.  Psychiatric: She has a normal mood and affect. Her behavior is normal.  Vitals reviewed.         Assessment & Plan:

## 2014-12-21 NOTE — Assessment & Plan Note (Signed)
Recurrent problem for pt.  Description of odor and d/c consistent w/ BV but pt w/o odor or d/c on PE.  Wet prep collected and sent.  Will hold tx until results available and then if + for BV, will send Metrogel.  Pt expressed understanding and is in agreement w/ plan.

## 2014-12-21 NOTE — Progress Notes (Signed)
Pre visit review using our clinic review tool, if applicable. No additional management support is needed unless otherwise documented below in the visit note. 

## 2014-12-22 ENCOUNTER — Other Ambulatory Visit: Payer: Self-pay | Admitting: Family Medicine

## 2014-12-22 LAB — CERVICOVAGINAL ANCILLARY ONLY: WET PREP (BD AFFIRM): POSITIVE — AB

## 2014-12-22 MED ORDER — METRONIDAZOLE 0.75 % VA GEL: 1.0000 | Freq: Every day | VAGINAL | Status: DC

## 2015-05-30 ENCOUNTER — Telehealth: Payer: Self-pay | Admitting: *Deleted

## 2015-05-30 NOTE — Telephone Encounter (Signed)
Received fax from "Pharmacy Services" ph) [phone]  Fax) [phone] for comgination of: lidocaine oint 5%, diclofenac gel 3%, doxepin HCL 5% cream, fluocinonide cream 0.1% and naproxed sodium ER 375mg  tabs three times a day.  Verified with pt that she is not wanting to use this and to discard request. Denial faxed to above #.

## 2015-06-25 ENCOUNTER — Encounter: Payer: Self-pay | Admitting: Family

## 2015-06-25 ENCOUNTER — Other Ambulatory Visit (HOSPITAL_COMMUNITY)
Admission: RE | Admit: 2015-06-25 | Discharge: 2015-06-25 | Disposition: A | Discharge status: Home / Self Care | Payer: Managed Care, Other (non HMO) | Source: Ambulatory Visit | Attending: Family | Admitting: Family

## 2015-06-25 ENCOUNTER — Ambulatory Visit (INDEPENDENT_AMBULATORY_CARE_PROVIDER_SITE_OTHER): Payer: Managed Care, Other (non HMO) | Admitting: Family

## 2015-06-25 VITALS — BP 139/81 | HR 76 | Temp 98.9°F | Resp 18 | Ht 64.75 in | Wt 112.4 lb

## 2015-06-25 DIAGNOSIS — Z113 Encounter for screening for infections with a predominantly sexual mode of transmission: Secondary | ICD-10-CM | POA: Diagnosis not present

## 2015-06-25 DIAGNOSIS — N760 Acute vaginitis: Secondary | ICD-10-CM | POA: Diagnosis not present

## 2015-06-25 DIAGNOSIS — R102 Pelvic and perineal pain: Secondary | ICD-10-CM | POA: Diagnosis not present

## 2015-06-25 LAB — POCT URINALYSIS DIPSTICK
Bilirubin, UA: NEGATIVE
Blood, UA: NEGATIVE
Glucose, UA: NEGATIVE
Ketones, UA: NEGATIVE
LEUKOCYTES UA: NEGATIVE
Nitrite, UA: NEGATIVE
PROTEIN UA: NEGATIVE
Spec Grav, UA: >=1.03
Urobilinogen, UA: 2
pH, UA: 6

## 2015-06-25 NOTE — Progress Notes (Signed)
Pre visit review using our clinic review tool, if applicable. No additional management support is needed unless otherwise documented below in the visit note. 

## 2015-06-25 NOTE — Patient Instructions (Addendum)
You will be contacted about your referral to GYN for further evaluation of your pelvic pain.  We will let you now how your testing looks.
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
## 2015-06-27 LAB — CERVICOVAGINAL ANCILLARY ONLY
CHLAMYDIA, DNA PROBE: NEGATIVE
Neisseria Gonorrhea: NEGATIVE
Wet Prep (BD Affirm): NEGATIVE

## 2015-06-28 LAB — URINE CULTURE
Colony Count: NO GROWTH
ORGANISM ID, BACTERIA: NO GROWTH

## 2015-06-28 NOTE — Assessment & Plan Note (Signed)
Resolved.  Swabs negative for GC/Chlamyia, yeast, bacteria, trich. Urine culture negative for bacteria.

## 2015-07-11 ENCOUNTER — Encounter: Payer: Managed Care, Other (non HMO) | Admitting: Obstetrics & Gynecology

## 2015-07-11 DIAGNOSIS — R102 Pelvic and perineal pain: Secondary | ICD-10-CM

## 2015-12-24 ENCOUNTER — Telehealth: Payer: Self-pay | Admitting: *Deleted

## 2015-12-24 NOTE — Telephone Encounter (Signed)
Received fax from [facility]. Ph) [phone]. Fax) [phone]. Requesting order for topical pain medication and emulsifier. Spoke with pt and confirmed that she is not requesting this Rx. Denial faxed back to above number.

## 2016-02-13 ENCOUNTER — Ambulatory Visit (INDEPENDENT_AMBULATORY_CARE_PROVIDER_SITE_OTHER): Payer: Managed Care, Other (non HMO) | Admitting: Medical

## 2016-02-13 ENCOUNTER — Telehealth: Payer: Self-pay | Admitting: Medical

## 2016-02-13 ENCOUNTER — Encounter: Payer: Self-pay | Admitting: Medical

## 2016-02-13 ENCOUNTER — Other Ambulatory Visit (HOSPITAL_COMMUNITY)
Admission: RE | Admit: 2016-02-13 | Discharge: 2016-02-13 | Disposition: A | Discharge status: Home / Self Care | Payer: Managed Care, Other (non HMO) | Source: Ambulatory Visit | Attending: Medical | Admitting: Medical

## 2016-02-13 VITALS — BP 119/73 | HR 73 | Temp 98.2°F | Resp 16 | Ht 65.0 in | Wt 108.5 lb

## 2016-02-13 DIAGNOSIS — Z113 Encounter for screening for infections with a predominantly sexual mode of transmission: Secondary | ICD-10-CM | POA: Diagnosis present

## 2016-02-13 DIAGNOSIS — R822 Biliuria: Secondary | ICD-10-CM

## 2016-02-13 DIAGNOSIS — N898 Other specified noninflammatory disorders of vagina: Secondary | ICD-10-CM

## 2016-02-13 DIAGNOSIS — N760 Acute vaginitis: Secondary | ICD-10-CM

## 2016-02-13 DIAGNOSIS — Z0000 Encounter for general adult medical examination without abnormal findings: Secondary | ICD-10-CM

## 2016-02-13 DIAGNOSIS — R102 Pelvic and perineal pain: Secondary | ICD-10-CM | POA: Diagnosis not present

## 2016-02-13 DIAGNOSIS — B9689 Other specified bacterial agents as the cause of diseases classified elsewhere: Secondary | ICD-10-CM | POA: Diagnosis not present

## 2016-02-13 DIAGNOSIS — Z01419 Encounter for gynecological examination (general) (routine) without abnormal findings: Secondary | ICD-10-CM

## 2016-02-13 DIAGNOSIS — Z1231 Encounter for screening mammogram for malignant neoplasm of breast: Secondary | ICD-10-CM

## 2016-02-13 LAB — POC URINALSYSI DIPSTICK (AUTOMATED)
Bilirubin, UA: NEGATIVE
Glucose, UA: NEGATIVE
KETONES UA: NEGATIVE
LEUKOCYTES UA: NEGATIVE
Nitrite, UA: NEGATIVE
Protein, UA: POSITIVE
RBC UA: NEGATIVE
Spec Grav, UA: >=1.03
Urobilinogen, UA: 4
pH, UA: 6

## 2016-02-13 MED ORDER — METRONIDAZOLE 0.75 % VA GEL: 1.0000 | Freq: Two times a day (BID) | VAGINAL | 0 refills | Status: DC

## 2016-02-13 NOTE — Telephone Encounter (Signed)
See surgeon referral. But also would like to talk with you about referral.

## 2016-02-13 NOTE — Patient Instructions (Addendum)
    For you history of recurrent vaginal dc with odor, I decided to do pap today(since due) and go ahead and do direct testing(ancillary studies) since should be faster result time than urine studies. Will go ahead and rx flagyl vaginal tx pending study results   Mammogram order placed as well.  Urine culture to be sent out today.  Hydrate well and use flagyl(treat for probable bv). Will notify you of results as they come in.  If test negative and symptoms persist or recurrent will ask you see gyn or see pcp.  Follow up in 2 wks or as needed

## 2016-02-13 NOTE — Telephone Encounter (Signed)
Please see the labs that were ordered yesterday. Papsmear, urine culture and g and c, bv, trich, candida etc. Please make sure all sent out correclty/processed correctly.

## 2016-02-13 NOTE — Progress Notes (Signed)
Pre visit review using our clinic review tool, if applicable. No additional management support is needed unless otherwise documented below in the visit note/SLS  

## 2016-02-13 NOTE — Telephone Encounter (Signed)
Future labs placed for urobilinogen in urine. Placed order under bilirubin in urine.
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
## 2016-02-14 NOTE — Telephone Encounter (Signed)
Referral was on other pt. Sorry.

## 2016-02-14 NOTE — Telephone Encounter (Signed)
I don't see a referral for this patient

## 2016-02-15 LAB — CERVICOVAGINAL ANCILLARY ONLY
Chlamydia: NEGATIVE
NEISSERIA GONORRHEA: NEGATIVE
Wet Prep (BD Affirm): POSITIVE — AB

## 2016-02-15 LAB — CYTOLOGY - PAP: Diagnosis: NEGATIVE

## 2016-02-15 LAB — CULTURE, URINE COMPREHENSIVE: Organism ID, Bacteria: NO GROWTH

## 2016-02-16 ENCOUNTER — Telehealth: Payer: Self-pay | Admitting: Medical

## 2016-02-16 MED ORDER — TINIDAZOLE 500 MG PO TABS: 2.0000 g | ORAL_TABLET | Freq: Every day | ORAL | 0 refills | Status: DC

## 2016-02-16 NOTE — Telephone Encounter (Signed)
Rx of tinidazole printed. I don't want her to take until she talks with one of us and options discussed. So don't fax to pharmacy yet.

## 2016-02-21 NOTE — Telephone Encounter (Signed)
Called patient and discussed lab results.  She stated understanding and agreed to take oral tinidazole.  She was advised to call us if she has any side effects with medication.  Rx faxed to pharmacy.

## 2016-02-21 NOTE — Telephone Encounter (Signed)
Notes Recorded by Esperanza RichtersEdward Saguier, PA-C on 02/16/2016 at 5:48 PM EST rx tinidazole should be printed. Don't fax until one of us discuss options with her. ------  Notes Recorded by Esperanza RichtersEdward Saguier, PA-C on 02/16/2016 at 5:39 PM EST No gonorrhea or chlamydia seen on study. But she does have bv and trichomonas. Vaginal flagyl can treat bv. But cure rates for trich with vaginal flagyl are not that great(only about 44% per very reputable site pub med). Oral flagyl is better but she has history of nausea and vomiting. There is other med tinidazole gram 2 gram oral single dose. But maybe she can't tolerate it as well difficult to say. Or if she is hesitant to take tinidazole can repeat urine ancillary and see if trichomonas still present after vaginal flagyl treatment. Let me know what she says. Will go ahead and send tinidazole to her pharmacy. But I want her to talk to us first before she uses. And if she does use give us quick up date on if any side effects/particulary and GI.

## 2016-05-02 ENCOUNTER — Ambulatory Visit
Admission: RE | Admit: 2016-05-02 | Discharge: 2016-05-02 | Disposition: A | Discharge status: Home / Self Care | Payer: Managed Care, Other (non HMO) | Source: Ambulatory Visit | Attending: Psychiatry | Admitting: Psychiatry

## 2016-05-02 DIAGNOSIS — Z0000 Encounter for general adult medical examination without abnormal findings: Secondary | ICD-10-CM

## 2016-05-02 DIAGNOSIS — Z1231 Encounter for screening mammogram for malignant neoplasm of breast: Secondary | ICD-10-CM

## 2016-12-03 ENCOUNTER — Encounter: Payer: Self-pay | Admitting: Medical

## 2016-12-03 ENCOUNTER — Ambulatory Visit (INDEPENDENT_AMBULATORY_CARE_PROVIDER_SITE_OTHER): Payer: Managed Care, Other (non HMO) | Admitting: Medical

## 2016-12-03 VITALS — BP 156/88 | HR 64 | Temp 98.1°F | Resp 16 | Wt 114.2 lb

## 2016-12-03 DIAGNOSIS — N898 Other specified noninflammatory disorders of vagina: Secondary | ICD-10-CM | POA: Diagnosis not present

## 2016-12-03 LAB — POC URINALSYSI DIPSTICK (AUTOMATED)
BILIRUBIN UA: NEGATIVE
GLUCOSE UA: NEGATIVE
Ketones, UA: NEGATIVE
Leukocytes, UA: NEGATIVE
NITRITE UA: NEGATIVE
PH UA: 6.5 (ref 5.0–8.0)
Protein, UA: NEGATIVE
RBC UA: NEGATIVE
Spec Grav, UA: 1.02 (ref 1.010–1.025)
Urobilinogen, UA: NEGATIVE E.U./dL — AB

## 2016-12-03 MED ORDER — TINIDAZOLE 500 MG PO TABS: ORAL_TABLET | ORAL | 0 refills | Status: DC

## 2016-12-03 MED ORDER — FLUCONAZOLE 150 MG PO TABS: 150.0000 mg | ORAL_TABLET | Freq: Once | ORAL | 0 refills | Status: AC
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
## 2016-12-03 NOTE — Patient Instructions (Addendum)
With your recent symptoms and history of bacterial vaginosis.  I am prescribing tinidazole.  You tolerated this better than Flagyl last time.  Rx advised and given.  I am also providing a printed prescription of Diflucan if you present with yeast infection type symptoms.  We are doing a urine culture and ancillary studies.  We will let you know the results of the studies when they are in.  If you have worsening or changing symptoms please let us know.  Follow-up in 7 days or as needed.

## 2016-12-06 LAB — URINE CULTURE
MICRO NUMBER:: 81284048
SPECIMEN QUALITY:: ADEQUATE

## 2016-12-08 ENCOUNTER — Telehealth: Payer: Self-pay | Admitting: Medical

## 2016-12-08 MED ORDER — CIPROFLOXACIN HCL 500 MG PO TABS: 500.0000 mg | ORAL_TABLET | Freq: Two times a day (BID) | ORAL | 0 refills | Status: DC

## 2016-12-08 NOTE — Telephone Encounter (Signed)
Rx cipro sent to pt pharmacy. I tried to call pt but no answer regarding her uti per culture results. I will get MA to call pt since she did not pick up.

## 2016-12-24 ENCOUNTER — Telehealth: Payer: Self-pay | Admitting: Medical

## 2016-12-24 NOTE — Telephone Encounter (Signed)
Patient was seen in November.  Urine studies were done and she was found to have a UTI.  Cipro was sent to her pharmacy.  Your note states this patient was notified.  The urine ancillary studies that I did never came back.  Someone along the line made a mistake?? I really do not understand what happened.  But would you call patient and verify that she is doing well/asymptomatic.  Probably just had a UTI as a culture showed.

## 2016-12-25 NOTE — Telephone Encounter (Signed)
Pt states she is much better

## 2017-02-18 ENCOUNTER — Ambulatory Visit: Payer: Self-pay

## 2017-02-18 NOTE — Telephone Encounter (Signed)
Patient called in with "abdominal pain x 3 days." She said "it came on all of a sudden 3 days ago and it shoots up into my chest when it comes. It's off and on pain, not lasting a long time, but at times it has lasted all day, not real bad but enough for me to notice." I asked is she in pain at this time, she denies. She said "I have been constipated the past 3 days, but no nausea, diarrhea, vomiting." According to protocol, see PCP within 4 hours, appointment made for tomorrow with Esperanza RichtersEdward Saguier, [facility], care advice given, she verbalized understanding.   Reason for Disposition . [1] MILD pain (e.g., does not interfere with normal activities) AND [2] pain comes and goes (cramps) AND [3] present > 48 hours  Answer Assessment - Initial Assessment Questions 1. LOCATION: "Where does it hurt?"      Lower abdomen 2. RADIATION: "Does the pain shoot anywhere else?" (e.g., chest, back)     Up into my chest sometimes 3. ONSET: "When did the pain begin?" (e.g., minutes, hours or days ago)      About 3 days ago 4. SUDDEN: "Gradual or sudden onset?"     Sudden onset 3 days ago, gradually got worse 5. PATTERN "Does the pain come and go, or is it constant?"    - If constant: "Is it getting better, staying the same, or worsening?"      (Note: Constant means the pain never goes away completely; most serious pain is constant and it progresses)     - If intermittent: "How long does it last?" "Do you have pain now?"     (Note: Intermittent means the pain goes away completely between bouts)     Come and go, doesn't last long, eases off 6. SEVERITY: "How bad is the pain?"  (e.g., Scale 1-10; mild, moderate, or severe)   - MILD (1-3): doesn't interfere with normal activities, abdomen soft and not tender to touch    - MODERATE (4-7): interferes with normal activities or awakens from sleep, tender to touch    - SEVERE (8-10): excruciating pain, doubled over, unable to do any normal activities      Denies 7. RECURRENT  SYMPTOM: "Have you ever had this type of abdominal pain before?" If so, ask: "When was the last time?" and "What happened that time?"      No 8. CAUSE: "What do you think is causing the abdominal pain?"     Unknown 9. RELIEVING/AGGRAVATING FACTORS: "What makes it better or worse?" (e.g., movement, antacids, bowel movement)     Rest 10. OTHER SYMPTOMS: "Has there been any vomiting, diarrhea, constipation, or urine problems?"       Constipation in past 3 days 11. PREGNANCY: "Is there any chance you are pregnant?" "When was your last menstrual period?"       No  Protocols used: [facility]AL [facility]

## 2017-02-19 ENCOUNTER — Encounter: Payer: Self-pay | Admitting: Medical

## 2017-02-19 ENCOUNTER — Ambulatory Visit (HOSPITAL_BASED_OUTPATIENT_CLINIC_OR_DEPARTMENT_OTHER)
Admission: RE | Admit: 2017-02-19 | Discharge: 2017-02-19 | Disposition: A | Discharge status: Home / Self Care | Payer: Managed Care, Other (non HMO) | Source: Ambulatory Visit | Attending: Medical | Admitting: Medical

## 2017-02-19 ENCOUNTER — Ambulatory Visit (HOSPITAL_BASED_OUTPATIENT_CLINIC_OR_DEPARTMENT_OTHER): Payer: Managed Care, Other (non HMO)

## 2017-02-19 ENCOUNTER — Telehealth: Payer: Self-pay | Admitting: Medical

## 2017-02-19 ENCOUNTER — Ambulatory Visit (INDEPENDENT_AMBULATORY_CARE_PROVIDER_SITE_OTHER): Payer: Managed Care, Other (non HMO) | Admitting: Medical

## 2017-02-19 VITALS — BP 137/86 | HR 69 | Temp 98.3°F | Resp 16 | Ht 65.0 in | Wt 113.8 lb

## 2017-02-19 DIAGNOSIS — R1031 Right lower quadrant pain: Secondary | ICD-10-CM | POA: Diagnosis not present

## 2017-02-19 DIAGNOSIS — R102 Pelvic and perineal pain: Secondary | ICD-10-CM | POA: Diagnosis not present

## 2017-02-19 DIAGNOSIS — K5900 Constipation, unspecified: Secondary | ICD-10-CM

## 2017-02-19 LAB — POC URINALSYSI DIPSTICK (AUTOMATED)
Bilirubin, UA: NEGATIVE
Blood, UA: NEGATIVE
Glucose, UA: NEGATIVE
Ketones, UA: NEGATIVE
Leukocytes, UA: NEGATIVE
NITRITE UA: NEGATIVE
PH UA: 6.5 (ref 5.0–8.0)
PROTEIN UA: NEGATIVE
Spec Grav, UA: 1.015 (ref 1.010–1.025)
Urobilinogen, UA: NEGATIVE E.U./dL — AB

## 2017-02-19 LAB — POCT URINE PREGNANCY: PREG TEST UR: NEGATIVE

## 2017-02-19 MED ORDER — DICLOFENAC SODIUM 75 MG PO TBEC: 75.0000 mg | DELAYED_RELEASE_TABLET | Freq: Two times a day (BID) | ORAL | 0 refills | Status: DC

## 2017-02-19 MED FILL — DICLOFENAC SODIUM 75 MG TAB: 75 | 15 days supply | Qty: 30 | Fill #0
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
## 2017-02-19 NOTE — Patient Instructions (Signed)
For your recent right lower abdomen/adnexal area pain we will get ultrasound studies to evaluate uterus and ovaries.  Prescription of diclofenac  for pain. Stop otc anti-inflammatories.  Will also get xray of abdomen today to assess stool burden/volume.  Will let you know results of studies as they come in.  Follow up in 7 days or as needed

## 2017-02-19 NOTE — Telephone Encounter (Signed)
Open to refer patient to gynecologist.

## 2017-02-25 ENCOUNTER — Telehealth: Payer: Self-pay | Admitting: *Deleted

## 2017-02-25 DIAGNOSIS — R102 Pelvic and perineal pain: Secondary | ICD-10-CM

## 2017-02-25 NOTE — Telephone Encounter (Signed)
Previous referral to [facility] cancelled and re-entered for [facility].   Copied from CRM [id number]. Topic: General - Other >> Feb 24, 2017 12:38 PM Gerrianne ScalePayne, Angela L wrote: Reason for CRM: patient calling to give the name of a OBGYN for a referral the name [facility] in [place],

## 2017-03-02 DIAGNOSIS — R102 Pelvic and perineal pain unspecified side: Secondary | ICD-10-CM | POA: Insufficient documentation

## 2017-03-02 HISTORY — DX: Pelvic and perineal pain: R10.2

## 2018-03-12 ENCOUNTER — Encounter (HOSPITAL_BASED_OUTPATIENT_CLINIC_OR_DEPARTMENT_OTHER): Payer: Self-pay | Admitting: *Deleted

## 2018-03-12 ENCOUNTER — Other Ambulatory Visit: Payer: Self-pay

## 2018-03-12 ENCOUNTER — Emergency Department (HOSPITAL_BASED_OUTPATIENT_CLINIC_OR_DEPARTMENT_OTHER)
Admission: EM | Admit: 2018-03-12 | Discharge: 2018-03-12 | Disposition: A | Discharge status: Home / Self Care | Payer: Managed Care, Other (non HMO) | Attending: Emergency Medicine | Admitting: Emergency Medicine

## 2018-03-12 DIAGNOSIS — N3001 Acute cystitis with hematuria: Secondary | ICD-10-CM | POA: Diagnosis not present

## 2018-03-12 DIAGNOSIS — Z79899 Other long term (current) drug therapy: Secondary | ICD-10-CM | POA: Insufficient documentation

## 2018-03-12 DIAGNOSIS — R319 Hematuria, unspecified: Secondary | ICD-10-CM | POA: Diagnosis present

## 2018-03-12 LAB — URINALYSIS, ROUTINE W REFLEX MICROSCOPIC
BILIRUBIN URINE: NEGATIVE
Glucose, UA: NEGATIVE mg/dL
KETONES UR: 15 mg/dL — AB
NITRITE: NEGATIVE
PH: 7.5 (ref 5.0–8.0)
Protein, ur: 100 mg/dL — AB
Specific Gravity, Urine: 1.02 (ref 1.005–1.030)

## 2018-03-12 LAB — URINALYSIS, MICROSCOPIC (REFLEX): RBC / HPF: >50 RBC/hpf (ref 0–5)

## 2018-03-12 MED ORDER — CEPHALEXIN 250 MG PO CAPS
500.0000 mg | ORAL_CAPSULE | Freq: Once | ORAL | Status: AC
  Administered 2018-03-12: 500 mg via ORAL
  Filled 2018-03-12: qty 2

## 2018-03-12 MED ORDER — CEPHALEXIN 500 MG PO CAPS: 500.0000 mg | ORAL_CAPSULE | Freq: Two times a day (BID) | ORAL | 0 refills | Status: AC

## 2018-03-12 MED ORDER — PHENAZOPYRIDINE HCL 200 MG PO TABS: 200.0000 mg | ORAL_TABLET | Freq: Three times a day (TID) | ORAL | 0 refills | Status: DC

## 2018-03-12 MED ORDER — PHENAZOPYRIDINE HCL 100 MG PO TABS
200.0000 mg | ORAL_TABLET | Freq: Once | ORAL | Status: AC
  Administered 2018-03-12: 200 mg via ORAL
  Filled 2018-03-12: qty 2

## 2018-03-12 NOTE — Discharge Instructions (Signed)
Thank you for allowing me to care for you today. Please return to the emergency department if you have new or worsening symptoms. Take your medications as instructed.  ° °

## 2018-03-12 NOTE — ED Triage Notes (Signed)
Lower abdominal pain. Hematuria and urgency.
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
## 2018-03-15 LAB — URINE CULTURE

## 2018-03-16 ENCOUNTER — Telehealth: Payer: Self-pay | Admitting: Emergency Medicine

## 2018-03-16 NOTE — Telephone Encounter (Signed)
Post ED Visit - Positive Culture Follow-up  Culture report reviewed by antimicrobial stewardship pharmacist: [facility] []  Enzo Bi, Pharm.D. []  Celedonio Miyamoto, Pharm.D., BCPS AQ-ID []  Garvin Fila, Pharm.D., BCPS []  Georgina Pillion, Pharm.D., BCPS []  [place], [address].D., BCPS, AAHIVP []  Estella Husk, Pharm.D., BCPS, AAHIVP []  Lysle Pearl, PharmD, BCPS []  Phillips Climes, PharmD, BCPS []  Agapito Games, PharmD, BCPS []  Verlan Friends, PharmD []  Mervyn Gay, PharmD, BCPS []  Vinnie Level, PharmD Babs Bertin PharmD  [facility] []  Len Childs, PharmD []  Greer Pickerel, PharmD []  Adalberto Cole, PharmD []  Perlie Gold, Rph []  Lonell Face) Jean Rosenthal, PharmD []  Earl Many, PharmD []  Junita Push, PharmD []  Dorna Leitz, PharmD []  Terrilee Files, PharmD []  Lynann Beaver, PharmD []  Keturah Barre, PharmD []  Loralee Pacas, PharmD []  Bernadene Person, PharmD   Positive urine culture Treated with cephalexin, organism sensitive to the same and no further patient follow-up is required at this time.  Berle Mull 03/16/2018, 10:46 AM

## 2018-09-10 ENCOUNTER — Other Ambulatory Visit: Payer: Self-pay

## 2018-09-10 ENCOUNTER — Encounter (HOSPITAL_BASED_OUTPATIENT_CLINIC_OR_DEPARTMENT_OTHER): Payer: Self-pay | Admitting: *Deleted

## 2018-09-10 ENCOUNTER — Emergency Department (HOSPITAL_BASED_OUTPATIENT_CLINIC_OR_DEPARTMENT_OTHER)
Admission: EM | Admit: 2018-09-10 | Discharge: 2018-09-11 | Disposition: A | Discharge status: Home / Self Care | Payer: Managed Care, Other (non HMO) | Attending: Emergency Medicine | Admitting: Emergency Medicine

## 2018-09-10 DIAGNOSIS — N3000 Acute cystitis without hematuria: Secondary | ICD-10-CM | POA: Diagnosis not present

## 2018-09-10 DIAGNOSIS — Z888 Allergy status to other drugs, medicaments and biological substances status: Secondary | ICD-10-CM | POA: Diagnosis not present

## 2018-09-10 DIAGNOSIS — R102 Pelvic and perineal pain: Secondary | ICD-10-CM | POA: Diagnosis present

## 2018-09-10 DIAGNOSIS — Z79899 Other long term (current) drug therapy: Secondary | ICD-10-CM | POA: Insufficient documentation

## 2018-09-10 LAB — URINALYSIS, ROUTINE W REFLEX MICROSCOPIC
Bilirubin Urine: NEGATIVE
Glucose, UA: NEGATIVE mg/dL
Ketones, ur: NEGATIVE mg/dL
Nitrite: POSITIVE — AB
Protein, ur: 100 mg/dL — AB
Specific Gravity, Urine: 1.025 (ref 1.005–1.030)
pH: 7 (ref 5.0–8.0)

## 2018-09-10 LAB — URINALYSIS, MICROSCOPIC (REFLEX): RBC / HPF: >50 RBC/hpf (ref 0–5)

## 2018-09-10 MED ORDER — NITROFURANTOIN MONOHYD MACRO 100 MG PO CAPS
100.0000 mg | ORAL_CAPSULE | Freq: Once | ORAL | Status: AC
  Administered 2018-09-11: 100 mg via ORAL
  Filled 2018-09-10: qty 1

## 2018-09-10 MED ORDER — PHENAZOPYRIDINE HCL 100 MG PO TABS
200.0000 mg | ORAL_TABLET | Freq: Once | ORAL | Status: AC
  Administered 2018-09-11: 200 mg via ORAL
  Filled 2018-09-10: qty 2

## 2018-09-10 NOTE — ED Triage Notes (Signed)
Abdominal pain and dysuria. Hx of UTI a few months ago. States this pain feels the same. She has noted hematuria.

## 2018-09-11 ENCOUNTER — Encounter (HOSPITAL_BASED_OUTPATIENT_CLINIC_OR_DEPARTMENT_OTHER): Payer: Self-pay | Admitting: Emergency Medicine

## 2018-09-11 MED ORDER — NITROFURANTOIN MONOHYD MACRO 100 MG PO CAPS: 100.0000 mg | ORAL_CAPSULE | Freq: Two times a day (BID) | ORAL | 0 refills | Status: DC

## 2018-09-11 MED ORDER — PHENAZOPYRIDINE HCL 200 MG PO TABS: 200.0000 mg | ORAL_TABLET | Freq: Three times a day (TID) | ORAL | 0 refills | Status: DC
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
## 2018-10-01 ENCOUNTER — Other Ambulatory Visit: Payer: Self-pay

## 2018-10-01 ENCOUNTER — Ambulatory Visit (INDEPENDENT_AMBULATORY_CARE_PROVIDER_SITE_OTHER): Payer: Managed Care, Other (non HMO) | Admitting: Family

## 2018-10-01 DIAGNOSIS — N760 Acute vaginitis: Secondary | ICD-10-CM

## 2018-10-01 MED ORDER — FLUCONAZOLE 150 MG PO TABS: ORAL_TABLET | ORAL | 0 refills | Status: DC
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
## 2018-10-05 ENCOUNTER — Telehealth: Payer: Self-pay

## 2018-10-05 MED ORDER — METRONIDAZOLE 0.75 % VA GEL: 1.0000 | Freq: Every day | VAGINAL | 0 refills | Status: AC

## 2018-10-05 NOTE — Telephone Encounter (Signed)
Spoke to pt. rx given for metrogel. She is advised to let me know if she experiences nausea or if symptoms fail to improve. Pt verbalizes understanding.

## 2018-10-05 NOTE — Telephone Encounter (Signed)
SW patient regarding symptoms. Patient took Diflucan 150mg  on Friday 10/01/2018. Vaginal itching has resolved, continues to experience vaginal discharge and odor. Was advised at [facility] on 10/01/2018 to call if symptoms persist for possible BV treatment.  Please advise.

## 2018-10-27 ENCOUNTER — Emergency Department (HOSPITAL_BASED_OUTPATIENT_CLINIC_OR_DEPARTMENT_OTHER)
Admission: EM | Admit: 2018-10-27 | Discharge: 2018-10-28 | Disposition: A | Discharge status: Home / Self Care | Payer: Managed Care, Other (non HMO) | Attending: Emergency Medicine | Admitting: Emergency Medicine

## 2018-10-27 ENCOUNTER — Ambulatory Visit (INDEPENDENT_AMBULATORY_CARE_PROVIDER_SITE_OTHER): Payer: Managed Care, Other (non HMO) | Admitting: Family

## 2018-10-27 ENCOUNTER — Encounter: Payer: Self-pay | Admitting: Family

## 2018-10-27 ENCOUNTER — Encounter (HOSPITAL_BASED_OUTPATIENT_CLINIC_OR_DEPARTMENT_OTHER): Payer: Self-pay

## 2018-10-27 ENCOUNTER — Other Ambulatory Visit: Payer: Self-pay

## 2018-10-27 DIAGNOSIS — R519 Headache, unspecified: Secondary | ICD-10-CM | POA: Insufficient documentation

## 2018-10-27 DIAGNOSIS — Z20828 Contact with and (suspected) exposure to other viral communicable diseases: Secondary | ICD-10-CM | POA: Diagnosis not present

## 2018-10-27 DIAGNOSIS — G43909 Migraine, unspecified, not intractable, without status migrainosus: Secondary | ICD-10-CM

## 2018-10-27 DIAGNOSIS — I10 Essential (primary) hypertension: Secondary | ICD-10-CM | POA: Diagnosis not present

## 2018-10-27 DIAGNOSIS — Z888 Allergy status to other drugs, medicaments and biological substances status: Secondary | ICD-10-CM | POA: Diagnosis not present

## 2018-10-27 DIAGNOSIS — Z20822 Contact with and (suspected) exposure to covid-19: Secondary | ICD-10-CM

## 2018-10-27 MED ORDER — KETOROLAC TROMETHAMINE 60 MG/2ML IM SOLN
60.0000 mg | Freq: Once | INTRAMUSCULAR | Status: AC
  Administered 2018-10-27: 60 mg via INTRAMUSCULAR
  Filled 2018-10-27: qty 2

## 2018-10-27 MED ORDER — SUMATRIPTAN SUCCINATE 50 MG PO TABS: ORAL_TABLET | ORAL | 5 refills | Status: DC

## 2018-10-27 MED ORDER — DIPHENHYDRAMINE HCL 25 MG PO CAPS
25.0000 mg | ORAL_CAPSULE | Freq: Once | ORAL | Status: AC
  Administered 2018-10-27: 25 mg via ORAL
  Filled 2018-10-27: qty 1

## 2018-10-27 MED ORDER — METOCLOPRAMIDE HCL 5 MG/ML IJ SOLN
10.0000 mg | Freq: Once | INTRAMUSCULAR | Status: AC
  Administered 2018-10-27: 10 mg via INTRAMUSCULAR
  Filled 2018-10-27: qty 2

## 2018-10-27 MED ORDER — ONDANSETRON HCL 4 MG PO TABS: 4.0000 mg | ORAL_TABLET | Freq: Three times a day (TID) | ORAL | 0 refills | Status: DC | PRN

## 2018-10-27 NOTE — ED Triage Notes (Signed)
Pt c/o HA, nausea, "hot flashes but not chills" x 3 days-states she was in contact with a family member that was in contact with +covid person-pt states she was told by her work that she can not come back to work without being tested-NAD-steady gait
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
## 2018-10-28 LAB — SARS CORONAVIRUS 2 (TAT 6-24 HRS): SARS Coronavirus 2: NEGATIVE

## 2019-03-09 DIAGNOSIS — H40011 Open angle with borderline findings, low risk, right eye: Secondary | ICD-10-CM | POA: Insufficient documentation

## 2019-03-09 HISTORY — DX: Open angle with borderline findings, low risk, right eye: H40.011

## 2019-09-08 ENCOUNTER — Emergency Department (HOSPITAL_BASED_OUTPATIENT_CLINIC_OR_DEPARTMENT_OTHER)
Admission: EM | Admit: 2019-09-08 | Discharge: 2019-09-08 | Disposition: A | Discharge status: Home / Self Care | Payer: Managed Care, Other (non HMO) | Attending: Emergency Medicine | Admitting: Emergency Medicine

## 2019-09-08 ENCOUNTER — Encounter (HOSPITAL_BASED_OUTPATIENT_CLINIC_OR_DEPARTMENT_OTHER): Payer: Self-pay | Admitting: *Deleted

## 2019-09-08 ENCOUNTER — Other Ambulatory Visit: Payer: Self-pay

## 2019-09-08 DIAGNOSIS — A599 Trichomoniasis, unspecified: Secondary | ICD-10-CM | POA: Diagnosis not present

## 2019-09-08 DIAGNOSIS — B9689 Other specified bacterial agents as the cause of diseases classified elsewhere: Secondary | ICD-10-CM | POA: Diagnosis not present

## 2019-09-08 DIAGNOSIS — Z79899 Other long term (current) drug therapy: Secondary | ICD-10-CM | POA: Diagnosis not present

## 2019-09-08 DIAGNOSIS — N898 Other specified noninflammatory disorders of vagina: Secondary | ICD-10-CM | POA: Diagnosis present

## 2019-09-08 DIAGNOSIS — N739 Female pelvic inflammatory disease, unspecified: Secondary | ICD-10-CM | POA: Insufficient documentation

## 2019-09-08 DIAGNOSIS — N730 Acute parametritis and pelvic cellulitis: Secondary | ICD-10-CM

## 2019-09-08 DIAGNOSIS — N760 Acute vaginitis: Secondary | ICD-10-CM

## 2019-09-08 LAB — URINALYSIS, ROUTINE W REFLEX MICROSCOPIC
Bilirubin Urine: NEGATIVE
Glucose, UA: NEGATIVE mg/dL
Ketones, ur: NEGATIVE mg/dL
Nitrite: NEGATIVE
Protein, ur: NEGATIVE mg/dL
Specific Gravity, Urine: >1.03 — ABNORMAL HIGH (ref 1.005–1.030)
pH: 5.5 (ref 5.0–8.0)

## 2019-09-08 LAB — URINALYSIS, MICROSCOPIC (REFLEX)

## 2019-09-08 LAB — WET PREP, GENITAL
Sperm: NONE SEEN
Yeast Wet Prep HPF POC: NONE SEEN

## 2019-09-08 MED ORDER — ONDANSETRON 4 MG PO TBDP
8.0000 mg | ORAL_TABLET | Freq: Once | ORAL | Status: AC
  Administered 2019-09-08: 8 mg via ORAL
  Filled 2019-09-08: qty 2

## 2019-09-08 MED ORDER — CEFTRIAXONE SODIUM 500 MG IJ SOLR
500.0000 mg | Freq: Once | INTRAMUSCULAR | Status: AC
  Administered 2019-09-08: 500 mg via INTRAMUSCULAR
  Filled 2019-09-08: qty 500

## 2019-09-08 MED ORDER — DOXYCYCLINE HYCLATE 100 MG PO CAPS: 100.0000 mg | ORAL_CAPSULE | Freq: Two times a day (BID) | ORAL | 0 refills | Status: AC

## 2019-09-08 MED ORDER — DOXYCYCLINE HYCLATE 100 MG PO TABS
100.0000 mg | ORAL_TABLET | Freq: Once | ORAL | Status: AC
  Administered 2019-09-08: 100 mg via ORAL
  Filled 2019-09-08: qty 1

## 2019-09-08 MED ORDER — ONDANSETRON 4 MG PO TBDP: 4.0000 mg | ORAL_TABLET | Freq: Three times a day (TID) | ORAL | 0 refills | Status: DC | PRN

## 2019-09-08 MED ORDER — NAPROXEN 500 MG PO TABS: 500.0000 mg | ORAL_TABLET | Freq: Two times a day (BID) | ORAL | 0 refills | Status: DC

## 2019-09-08 MED ORDER — METRONIDAZOLE 500 MG PO TABS: 500.0000 mg | ORAL_TABLET | Freq: Two times a day (BID) | ORAL | 0 refills | Status: DC

## 2019-09-08 MED ORDER — METRONIDAZOLE 500 MG PO TABS
500.0000 mg | ORAL_TABLET | Freq: Once | ORAL | Status: AC
Start: 2019-09-08 — End: 2019-09-08
  Administered 2019-09-08: 500 mg via ORAL
  Filled 2019-09-08: qty 1

## 2019-09-08 NOTE — ED Notes (Signed)
Pelvic done with speculum and bimanual, spec to lab 

## 2019-09-08 NOTE — ED Triage Notes (Signed)
Lower abdominal pain. Vaginal discharge. She feels she may have BV or a UTI.

## 2019-09-08 NOTE — Discharge Instructions (Addendum)
Take the medication as prescribed.  I have prescribed Zofran to help with the Nausea with the Flagyl  Tylenol for pain and Naproxen  Follow up with [facility]  Return for new or worsening symptoms
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
## 2019-09-09 ENCOUNTER — Ambulatory Visit: Payer: Managed Care, Other (non HMO) | Admitting: Family Medicine

## 2019-09-09 LAB — GC/CHLAMYDIA PROBE AMP (~~LOC~~) NOT AT ARMC
Chlamydia: NEGATIVE
Comment: NEGATIVE
Comment: NORMAL
Neisseria Gonorrhea: NEGATIVE

## 2019-09-10 LAB — URINE CULTURE: Culture: NO GROWTH

## 2020-03-27 ENCOUNTER — Other Ambulatory Visit (HOSPITAL_COMMUNITY)
Admission: RE | Admit: 2020-03-27 | Discharge: 2020-03-27 | Disposition: A | Discharge status: Home / Self Care | Payer: Managed Care, Other (non HMO) | Source: Ambulatory Visit | Attending: Family | Admitting: Family

## 2020-03-27 ENCOUNTER — Ambulatory Visit (INDEPENDENT_AMBULATORY_CARE_PROVIDER_SITE_OTHER): Payer: Managed Care, Other (non HMO) | Admitting: Family

## 2020-03-27 ENCOUNTER — Encounter: Payer: Self-pay | Admitting: Family

## 2020-03-27 ENCOUNTER — Other Ambulatory Visit: Payer: Self-pay

## 2020-03-27 VITALS — BP 136/82 | HR 75 | Temp 98.8°F | Resp 16 | Ht 65.0 in | Wt 121.0 lb

## 2020-03-27 DIAGNOSIS — Z1159 Encounter for screening for other viral diseases: Secondary | ICD-10-CM

## 2020-03-27 DIAGNOSIS — R739 Hyperglycemia, unspecified: Secondary | ICD-10-CM | POA: Diagnosis not present

## 2020-03-27 DIAGNOSIS — Z0000 Encounter for general adult medical examination without abnormal findings: Secondary | ICD-10-CM

## 2020-03-27 DIAGNOSIS — R002 Palpitations: Secondary | ICD-10-CM | POA: Diagnosis not present

## 2020-03-27 DIAGNOSIS — G43909 Migraine, unspecified, not intractable, without status migrainosus: Secondary | ICD-10-CM

## 2020-03-27 DIAGNOSIS — Z23 Encounter for immunization: Secondary | ICD-10-CM | POA: Diagnosis not present

## 2020-03-27 DIAGNOSIS — Z01419 Encounter for gynecological examination (general) (routine) without abnormal findings: Secondary | ICD-10-CM | POA: Diagnosis present

## 2020-03-27 MED ORDER — SUMATRIPTAN SUCCINATE 50 MG PO TABS: ORAL_TABLET | ORAL | 5 refills | Status: DC

## 2020-03-27 MED ORDER — EPINEPHRINE 0.3 MG/0.3ML IJ SOAJ
0.3000 mg | INTRAMUSCULAR | 0 refills | Status: DC | PRN
Start: 2020-03-27 — End: 2022-03-11

## 2020-03-27 NOTE — Addendum Note (Signed)
Addended by: Sandford Craze on: 03/27/2020 05:21 PM   Modules accepted: Orders
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
## 2020-03-27 NOTE — Patient Instructions (Addendum)
Please complete lab work prior to leaving.  Try to add 30 minutes of exercise 5 days a week and incorporate more fresh/homemade food in your diet.  Cut back on your caffeine.

## 2020-03-28 LAB — COMPREHENSIVE METABOLIC PANEL
ALT: 9 U/L (ref 0–35)
AST: 15 U/L (ref 0–37)
Albumin: 4 g/dL (ref 3.5–5.2)
Alkaline Phosphatase: 114 U/L (ref 39–117)
BUN: 11 mg/dL (ref 6–23)
CO2: 28 mEq/L (ref 19–32)
Calcium: 9.3 mg/dL (ref 8.4–10.5)
Chloride: 104 mEq/L (ref 96–112)
Creatinine, Ser: 0.91 mg/dL (ref 0.40–1.20)
GFR: 71.5 mL/min (ref >60.00)
Glucose, Bld: 79 mg/dL (ref 70–99)
Potassium: 4.2 mEq/L (ref 3.5–5.1)
Sodium: 139 mEq/L (ref 135–145)
Total Bilirubin: 0.2 mg/dL (ref 0.2–1.2)
Total Protein: 7.4 g/dL (ref 6.0–8.3)

## 2020-03-28 LAB — LIPID PANEL
Cholesterol: 176 mg/dL (ref 0–200)
HDL: 63.7 mg/dL (ref >39.00)
LDL Cholesterol: 99 mg/dL (ref 0–99)
NonHDL: 112.11
Total CHOL/HDL Ratio: 3
Triglycerides: 67 mg/dL (ref 0.0–149.0)
VLDL: 13.4 mg/dL (ref 0.0–40.0)

## 2020-03-28 LAB — HEPATITIS C ANTIBODY
Hepatitis C Ab: NONREACTIVE
SIGNAL TO CUT-OFF: 0.03 (ref <1.00)

## 2020-03-29 LAB — CYTOLOGY - PAP
Adequacy: ABSENT
Comment: NEGATIVE
Diagnosis: NEGATIVE
High risk HPV: NEGATIVE

## 2020-04-06 ENCOUNTER — Other Ambulatory Visit: Payer: Self-pay

## 2020-04-07 ENCOUNTER — Other Ambulatory Visit: Payer: Self-pay

## 2020-04-07 ENCOUNTER — Ambulatory Visit (HOSPITAL_BASED_OUTPATIENT_CLINIC_OR_DEPARTMENT_OTHER)
Admission: RE | Admit: 2020-04-07 | Discharge: 2020-04-07 | Disposition: A | Discharge status: Home / Self Care | Payer: Managed Care, Other (non HMO) | Source: Ambulatory Visit | Attending: Family | Admitting: Family

## 2020-04-07 DIAGNOSIS — Z1231 Encounter for screening mammogram for malignant neoplasm of breast: Secondary | ICD-10-CM | POA: Insufficient documentation

## 2020-04-07 DIAGNOSIS — Z0000 Encounter for general adult medical examination without abnormal findings: Secondary | ICD-10-CM

## 2020-04-09 ENCOUNTER — Ambulatory Visit: Payer: Managed Care, Other (non HMO) | Admitting: Cardiology

## 2020-04-10 ENCOUNTER — Telehealth: Payer: Self-pay | Admitting: Family

## 2020-04-10 ENCOUNTER — Other Ambulatory Visit: Payer: Self-pay | Admitting: Family

## 2020-04-10 DIAGNOSIS — R928 Other abnormal and inconclusive findings on diagnostic imaging of breast: Secondary | ICD-10-CM

## 2020-04-10 NOTE — Telephone Encounter (Signed)
Please let pt know that the radiologist would like her to complete some additional breast images for further evaluation. Let me know if she has not been contacted by them about a follow up appointment in 1 week.   

## 2020-04-11 NOTE — Telephone Encounter (Signed)
Called patient but no answer, it looks like she is already scheduled for 04-30-2020

## 2020-04-30 ENCOUNTER — Other Ambulatory Visit: Payer: Managed Care, Other (non HMO)

## 2020-05-02 ENCOUNTER — Other Ambulatory Visit: Payer: Self-pay

## 2020-05-02 ENCOUNTER — Encounter: Payer: Self-pay | Admitting: Cardiology

## 2020-05-02 ENCOUNTER — Ambulatory Visit (INDEPENDENT_AMBULATORY_CARE_PROVIDER_SITE_OTHER): Payer: Managed Care, Other (non HMO)

## 2020-05-02 ENCOUNTER — Ambulatory Visit (INDEPENDENT_AMBULATORY_CARE_PROVIDER_SITE_OTHER): Payer: Managed Care, Other (non HMO) | Admitting: Cardiology

## 2020-05-02 VITALS — BP 142/88 | HR 76 | Ht 64.0 in | Wt 119.0 lb

## 2020-05-02 DIAGNOSIS — I38 Endocarditis, valve unspecified: Secondary | ICD-10-CM | POA: Diagnosis not present

## 2020-05-02 DIAGNOSIS — R002 Palpitations: Secondary | ICD-10-CM | POA: Insufficient documentation

## 2020-05-02 DIAGNOSIS — R030 Elevated blood-pressure reading, without diagnosis of hypertension: Secondary | ICD-10-CM

## 2020-05-02 NOTE — Patient Instructions (Addendum)
Medication Instructions:  Your physician recommends that you continue on your current medications as directed. Please refer to the Current Medication list given to you today.  *If you need a refill on your cardiac medications before your next appointment, please call your pharmacy*   Lab Work: None If you have labs (blood work) drawn today and your tests are completely normal, you will receive your results only by: Marland Kitchen MyChart Message (if you have MyChart) OR . A paper copy in the mail If you have any lab test that is abnormal or we need to change your treatment, we will call you to review the results.   Testing/Procedures: Your physician has requested that you have an echocardiogram. Echocardiography is a painless test that uses sound waves to create images of your heart. It provides your doctor with information about the size and shape of your heart and how well your heart's chambers and valves are working. This procedure takes approximately one hour. There are no restrictions for this procedure.  A zio monitor was ordered today. It will remain on for 14 days. You will then return monitor and event diary in provided box. It takes 1-2 weeks for report to be downloaded and returned to [place]. We will call you with the results. If monitor falls off or has orange flashing light, please call Zio for further instructions.     Follow-Up: At [facility], you and your health needs are our priority.  As part of our continuing mission to provide you with exceptional heart care, we have created designated Provider Care Teams.  These Care Teams include your primary Cardiologist (physician) and Advanced Practice Providers (APPs -  Physician Assistants and Nurse Practitioners) who all work together to provide you with the care you need, when you need it.  We recommend signing up for the patient portal called "MyChart".  Sign up information is provided on this After Visit Summary.  MyChart is used to connect  with patients for Virtual Visits (Telemedicine).  Patients are able to view lab/test results, encounter notes, upcoming appointments, etc.  Non-urgent messages can be sent to your provider as well.   To learn more about what you can do with MyChart, go to [email or web address].    Your next appointment:   4 week(s)  The format for your next appointment:   In Person  Provider:   Thomasene Ripple, DO   Other Instructions  WHY IS MY DOCTOR PRESCRIBING ZIO? The Zio system is proven and trusted by physicians to detect and diagnose irregular heart rhythms -- and has been prescribed to hundreds of thousands of patients.  The FDA has cleared the Zio system to monitor for many different kinds of irregular heart rhythms. In a study, physicians were able to reach a diagnosis 90% of the time with the Zio system1.  You can wear the Zio monitor -- a small, discreet, comfortable patch -- during your normal day-to-day activity, including while you sleep, shower, and exercise, while it records every single heartbeat for analysis.  1Barrett, P., et al. Comparison of 24 Hour Holter Monitoring Versus 14 Day Novel Adhesive Patch Electrocardiographic Monitoring. American Journal of Medicine, 2014.  ZIO VS. HOLTER MONITORING The Zio monitor can be comfortably worn for up to 7 days. Holter monitors can be worn for 24 to 48 hours, limiting the time to record any irregular heart rhythms you may have. Zio is able to capture data for the 51% of patients who have their first symptom-triggered arrhythmia after 48 hours.1  LIVE WITHOUT RESTRICTIONS The Zio ambulatory cardiac monitor is a small, unobtrusive, and water-resistant patch--you might even forget you're wearing it. The Zio monitor records and stores every beat of your heart, whether you're sleeping, working out, or showering. REMOVE ON: April 20TH, 2022 Echocardiogram An echocardiogram is a test that uses sound waves (ultrasound) to produce images of the  heart. Images from an echocardiogram can provide important information about:  Heart size and shape.  The size and thickness and movement of your heart's walls.  Heart muscle function and strength.  Heart valve function or if you have stenosis. Stenosis is when the heart valves are too narrow.  If blood is flowing backward through the heart valves (regurgitation).  A tumor or infectious growth around the heart valves.  Areas of heart muscle that are not working well because of poor blood flow or injury from a heart attack.  Aneurysm detection. An aneurysm is a weak or damaged part of an artery wall. The wall bulges out from the normal force of blood pumping through the body. Tell a health care provider about:  Any allergies you have.  All medicines you are taking, including vitamins, herbs, eye drops, creams, and over-the-counter medicines.  Any blood disorders you have.  Any surgeries you have had.  Any medical conditions you have.  Whether you are pregnant or may be pregnant. What are the risks? Generally, this is a safe test. However, problems may occur, including an allergic reaction to dye (contrast) that may be used during the test. What happens before the test? No specific preparation is needed. You may eat and drink normally. What happens during the test?  You will take off your clothes from the waist up and put on a hospital gown.  Electrodes or electrocardiogram (ECG)patches may be placed on your chest. The electrodes or patches are then connected to a device that monitors your heart rate and rhythm.  You will lie down on a table for an ultrasound exam. A gel will be applied to your chest to help sound waves pass through your skin.  A handheld device, called a transducer, will be pressed against your chest and moved over your heart. The transducer produces sound waves that travel to your heart and bounce back (or "echo" back) to the transducer. These sound waves  will be captured in real-time and changed into images of your heart that can be viewed on a video monitor. The images will be recorded on a computer and reviewed by your health care provider.  You may be asked to change positions or hold your breath for a short time. This makes it easier to get different views or better views of your heart.  In some cases, you may receive contrast through an IV in one of your veins. This can improve the quality of the pictures from your heart. The procedure may vary among health care providers and hospitals.   What can I expect after the test? You may return to your normal, everyday life, including diet, activities, and medicines, unless your health care provider tells you not to do that. Follow these instructions at home:  It is up to you to get the results of your test. Ask your health care provider, or the department that is doing the test, when your results will be ready.  Keep all follow-up visits. This is important. Summary  An echocardiogram is a test that uses sound waves (ultrasound) to produce images of the heart.  Images from an  echocardiogram can provide important information about the size and shape of your heart, heart muscle function, heart valve function, and other possible heart problems.  You do not need to do anything to prepare before this test. You may eat and drink normally.  After the echocardiogram is completed, you may return to your normal, everyday life, unless your health care provider tells you not to do that. This information is not intended to replace advice given to you by your health care provider. Make sure you discuss any questions you have with your health care provider. Document Revised: 08/30/2019 Document Reviewed: 08/30/2019 Elsevier Patient Education  2021 [employer].

## 2020-05-02 NOTE — Addendum Note (Signed)
Addended by: Reynolds Bowl on: 05/02/2020 02:16 PM   Modules accepted: Orders
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
## 2020-05-04 ENCOUNTER — Ambulatory Visit
Admission: RE | Admit: 2020-05-04 | Discharge: 2020-05-04 | Disposition: A | Discharge status: Home / Self Care | Payer: Managed Care, Other (non HMO) | Source: Ambulatory Visit | Attending: Family | Admitting: Family

## 2020-05-04 ENCOUNTER — Other Ambulatory Visit: Payer: Self-pay

## 2020-05-04 DIAGNOSIS — R928 Other abnormal and inconclusive findings on diagnostic imaging of breast: Secondary | ICD-10-CM

## 2020-06-06 ENCOUNTER — Encounter: Payer: Self-pay | Admitting: Cardiology

## 2020-06-06 ENCOUNTER — Other Ambulatory Visit: Payer: Self-pay

## 2020-06-06 ENCOUNTER — Ambulatory Visit (INDEPENDENT_AMBULATORY_CARE_PROVIDER_SITE_OTHER): Payer: Managed Care, Other (non HMO) | Admitting: Cardiology

## 2020-06-06 VITALS — BP 116/84 | HR 74 | Ht 66.0 in | Wt 122.1 lb

## 2020-06-06 DIAGNOSIS — I493 Ventricular premature depolarization: Secondary | ICD-10-CM | POA: Diagnosis not present

## 2020-06-06 DIAGNOSIS — I471 Supraventricular tachycardia, unspecified: Secondary | ICD-10-CM

## 2020-06-06 DIAGNOSIS — R002 Palpitations: Secondary | ICD-10-CM | POA: Diagnosis not present

## 2020-06-06 DIAGNOSIS — R011 Cardiac murmur, unspecified: Secondary | ICD-10-CM | POA: Insufficient documentation

## 2020-06-06 MED ORDER — PROPRANOLOL HCL 10 MG PO TABS: 10.0000 mg | ORAL_TABLET | Freq: Every evening | ORAL | 3 refills | Status: DC
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
## 2020-06-06 NOTE — Patient Instructions (Addendum)
Medication Instructions:  Your physician has recommended you make the following change in your medication:  START: Propanolol 10 mg at night  *If you need a refill on your cardiac medications before your next appointment, please call your pharmacy*   Lab Work: None If you have labs (blood work) drawn today and your tests are completely normal, you will receive your results only by: Marland Kitchen MyChart Message (if you have MyChart) OR . A paper copy in the mail If you have any lab test that is abnormal or we need to change your treatment, we will call you to review the results.   Testing/Procedures: None   Follow-Up: At [facility], you and your health needs are our priority.  As part of our continuing mission to provide you with exceptional heart care, we have created designated Provider Care Teams.  These Care Teams include your primary Cardiologist (physician) and Advanced Practice Providers (APPs -  Physician Assistants and Nurse Practitioners) who all work together to provide you with the care you need, when you need it.  We recommend signing up for the patient portal called "MyChart".  Sign up information is provided on this After Visit Summary.  MyChart is used to connect with patients for Virtual Visits (Telemedicine).  Patients are able to view lab/test results, encounter notes, upcoming appointments, etc.  Non-urgent messages can be sent to your provider as well.   To learn more about what you can do with MyChart, go to [email or web address].    Your next appointment:   3 month(s)  The format for your next appointment:   In Person  Provider:   Thomasene Ripple, DO   Other Instructions

## 2020-07-03 ENCOUNTER — Ambulatory Visit (HOSPITAL_COMMUNITY): Payer: Managed Care, Other (non HMO) | Attending: Internal Medicine

## 2020-07-03 ENCOUNTER — Other Ambulatory Visit: Payer: Self-pay

## 2020-07-03 DIAGNOSIS — I38 Endocarditis, valve unspecified: Secondary | ICD-10-CM | POA: Diagnosis not present

## 2020-07-03 LAB — ECHOCARDIOGRAM COMPLETE
Area-P 1/2: 2.95 cm2
S' Lateral: 2 cm

## 2020-09-06 ENCOUNTER — Ambulatory Visit (INDEPENDENT_AMBULATORY_CARE_PROVIDER_SITE_OTHER): Payer: Managed Care, Other (non HMO) | Admitting: Cardiology

## 2020-09-06 ENCOUNTER — Encounter: Payer: Self-pay | Admitting: Cardiology

## 2020-09-06 ENCOUNTER — Other Ambulatory Visit: Payer: Self-pay

## 2020-09-06 VITALS — BP 126/80 | HR 72 | Ht 66.0 in | Wt 122.0 lb

## 2020-09-06 DIAGNOSIS — R002 Palpitations: Secondary | ICD-10-CM

## 2020-09-06 DIAGNOSIS — I471 Supraventricular tachycardia: Secondary | ICD-10-CM | POA: Diagnosis not present

## 2020-09-06 DIAGNOSIS — I493 Ventricular premature depolarization: Secondary | ICD-10-CM

## 2020-09-06 MED ORDER — PROPRANOLOL HCL 10 MG PO TABS
10.0000 mg | ORAL_TABLET | Freq: Every evening | ORAL | 3 refills | Status: DC
Start: 2020-09-06 — End: 2021-04-02

## 2020-09-06 NOTE — Patient Instructions (Addendum)
Medication Instructions:  Your physician recommends that you continue on your current medications as directed. Please refer to the Current Medication list given to you today.  *If you need a refill on your cardiac medications before your next appointment, please call your pharmacy*   Lab Work: None If you have labs (blood work) drawn today and your tests are completely normal, you will receive your results only by: MyChart Message (if you have MyChart) OR A paper copy in the mail If you have any lab test that is abnormal or we need to change your treatment, we will call you to review the results.   Testing/Procedures: None   Follow-Up: At [facility], you and your health needs are our priority.  As part of our continuing mission to provide you with exceptional heart care, we have created designated Provider Care Teams.  These Care Teams include your primary Cardiologist (physician) and Advanced Practice Providers (APPs -  Physician Assistants and Nurse Practitioners) who all work together to provide you with the care you need, when you need it.  We recommend signing up for the patient portal called "MyChart".  Sign up information is provided on this After Visit Summary.  MyChart is used to connect with patients for Virtual Visits (Telemedicine).  Patients are able to view lab/test results, encounter notes, upcoming appointments, etc.  Non-urgent messages can be sent to your provider as well.   To learn more about what you can do with MyChart, go to [email or web address].    Your next appointment:   As needed  The format for your next appointment:   In Person  Provider:   [address] - Kardie Tobb, DO [address], [place], [place] [address]    Other Instructions   
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
## 2021-02-12 ENCOUNTER — Emergency Department (HOSPITAL_BASED_OUTPATIENT_CLINIC_OR_DEPARTMENT_OTHER)
Admission: EM | Admit: 2021-02-12 | Discharge: 2021-02-12 | Disposition: A | Discharge status: Home / Self Care | Payer: Managed Care, Other (non HMO) | Attending: Emergency Medicine | Admitting: Emergency Medicine

## 2021-02-12 ENCOUNTER — Other Ambulatory Visit: Payer: Self-pay

## 2021-02-12 ENCOUNTER — Encounter (HOSPITAL_BASED_OUTPATIENT_CLINIC_OR_DEPARTMENT_OTHER): Payer: Self-pay | Admitting: Urology

## 2021-02-12 DIAGNOSIS — Z881 Allergy status to other antibiotic agents status: Secondary | ICD-10-CM

## 2021-02-12 DIAGNOSIS — T360X5A Adverse effect of penicillins, initial encounter: Secondary | ICD-10-CM | POA: Insufficient documentation

## 2021-02-12 DIAGNOSIS — J069 Acute upper respiratory infection, unspecified: Secondary | ICD-10-CM | POA: Insufficient documentation

## 2021-02-12 DIAGNOSIS — J029 Acute pharyngitis, unspecified: Secondary | ICD-10-CM | POA: Diagnosis present

## 2021-02-12 MED ORDER — AMOXICILLIN-POT CLAVULANATE 875-125 MG PO TABS: 1.0000 | ORAL_TABLET | Freq: Two times a day (BID) | ORAL | 0 refills | Status: DC

## 2021-02-12 NOTE — Discharge Instructions (Signed)
He has sinusitis as a result of a viral upper respiratory infection given your symptoms that started last week with progression over the past few days.  As discussed you may also have TMJ disorder on that left side.  This may be worth discussing with your primary care provider or dentist.  If your symptoms worsen, becomes difficult to swallow, please return to the emergency room.  Does recommend you hydrate and follow-up with your primary care provider.
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
## 2021-02-12 NOTE — ED Triage Notes (Signed)
Sore throat x 3 days Pain on left side under ear, states ear pain r/t throat soreness  Denies fever Denies N/V/D

## 2021-03-29 ENCOUNTER — Encounter: Payer: Managed Care, Other (non HMO) | Admitting: Family

## 2021-04-02 ENCOUNTER — Encounter: Payer: Self-pay | Admitting: Family

## 2021-04-02 ENCOUNTER — Ambulatory Visit (INDEPENDENT_AMBULATORY_CARE_PROVIDER_SITE_OTHER): Payer: Managed Care, Other (non HMO) | Admitting: Family

## 2021-04-02 VITALS — BP 155/89 | HR 71 | Temp 98.4°F | Resp 16 | Ht 66.0 in | Wt 112.0 lb

## 2021-04-02 DIAGNOSIS — R634 Abnormal weight loss: Secondary | ICD-10-CM

## 2021-04-02 DIAGNOSIS — Z23 Encounter for immunization: Secondary | ICD-10-CM

## 2021-04-02 DIAGNOSIS — Z0000 Encounter for general adult medical examination without abnormal findings: Secondary | ICD-10-CM | POA: Diagnosis not present

## 2021-04-02 DIAGNOSIS — I10 Essential (primary) hypertension: Secondary | ICD-10-CM | POA: Insufficient documentation

## 2021-04-02 DIAGNOSIS — R030 Elevated blood-pressure reading, without diagnosis of hypertension: Secondary | ICD-10-CM

## 2021-04-02 MED ORDER — METOPROLOL SUCCINATE ER 50 MG PO TB24: 50.0000 mg | ORAL_TABLET | Freq: Every day | ORAL | 1 refills | Status: DC
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
## 2021-04-02 NOTE — Assessment & Plan Note (Addendum)
BP Readings from Last 3 Encounters:  ?04/02/21 (!) 155/89  ?02/12/21 (!) 142/93  ?09/06/20 126/80  ? ?Uncontrolled. Will add toprol xl 50mg  once daily. Hopefully this will help with bp and occasional palpitations.  ?

## 2021-04-02 NOTE — Patient Instructions (Addendum)
Please schedule routine dental exam.  ?Please complete lab work prior to leaving. ?Start toprol xl 50mg  once daily for blood pressure.  ?

## 2021-04-02 NOTE — Assessment & Plan Note (Signed)
She has had some unintentional weight loss. Recommended that she try to bring healthy snacks in her purse when she travels.  Refer for colo, mammo. Recommended that she get covid bivalent booster. Shingrix and flu shot today.  ?

## 2021-04-03 LAB — TSH: TSH: 1.99 u[IU]/mL (ref 0.35–5.50)

## 2021-10-07 IMAGING — MG MM DIGITAL DIAGNOSTIC UNILAT*R* W/ TOMO W/ CAD
4 series · 4 of 12 positions shown · non-contrast
Comparison: Previous exams including recent screening mammogram
dated 04/07/2020.

CLINICAL DATA: Patient returns today to evaluate a possible RIGHT
breast mass identified on recent screening mammogram. History of
bilateral breast cysts.

EXAM:
DIGITAL DIAGNOSTIC UNILATERAL RIGHT MAMMOGRAM WITH TOMOSYNTHESIS AND
CAD; ULTRASOUND RIGHT BREAST LIMITED
TECHNIQUE: Right digital diagnostic mammography and breast tomosynthesis was
performed. The images were evaluated with computer-aided detection.;
Targeted ultrasound examination of the right breast was performed

[R MLO synth-2D]
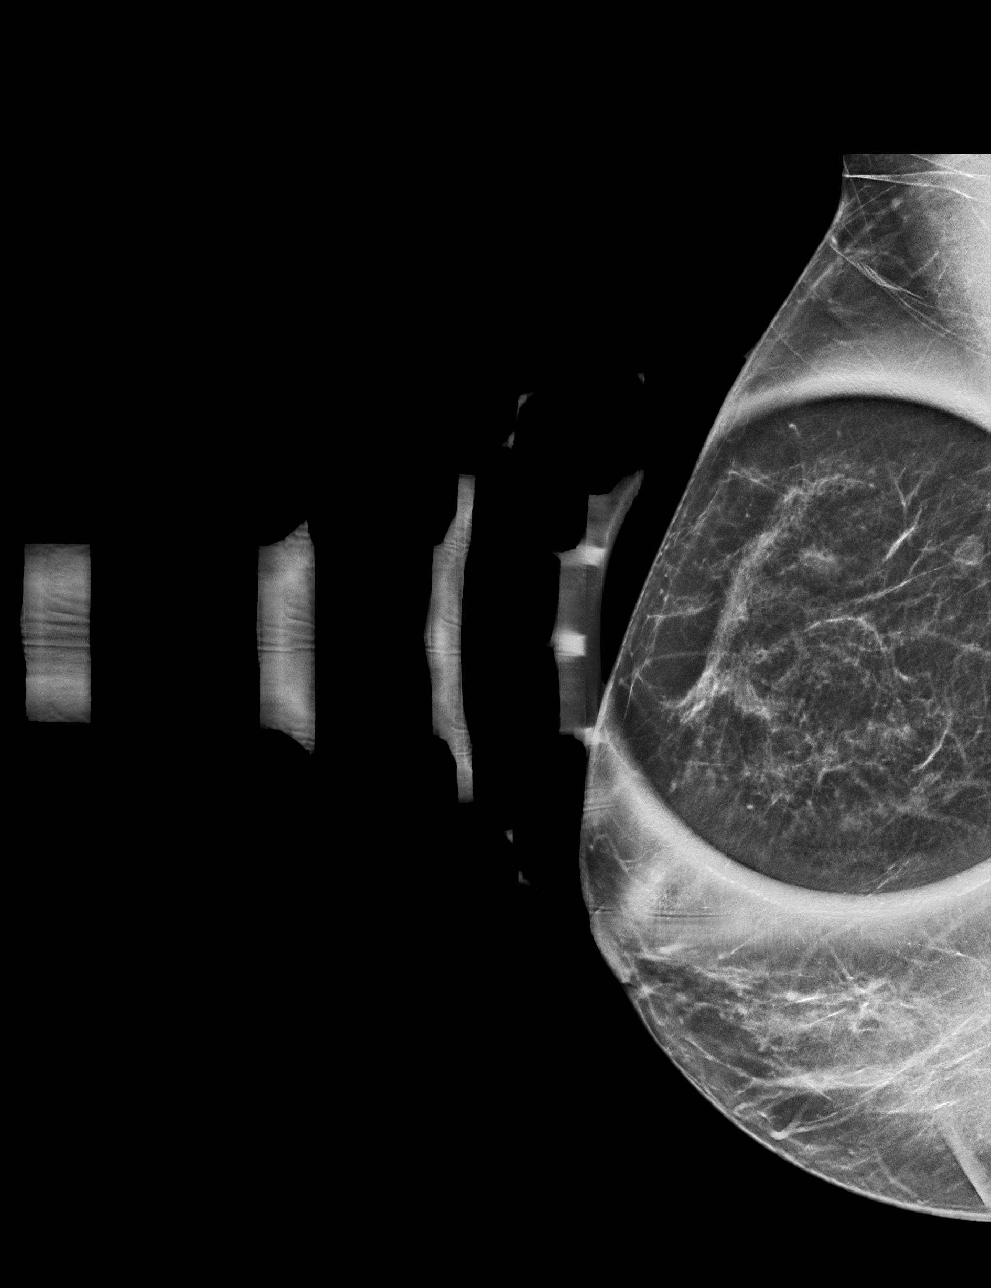

[R CC synth-2D]
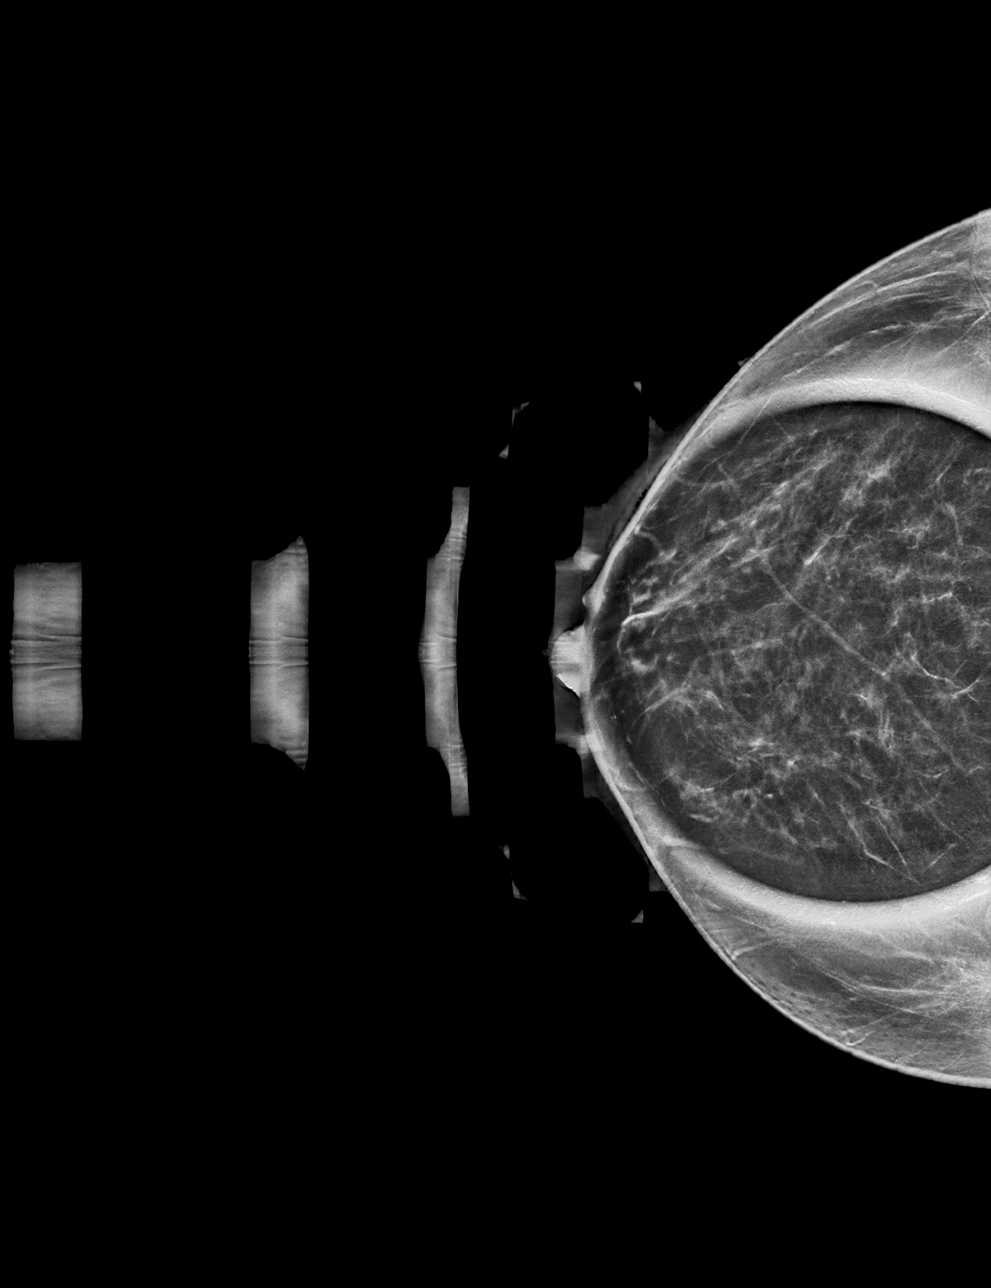

[R CC tomo · tomo slice 28/55.0]
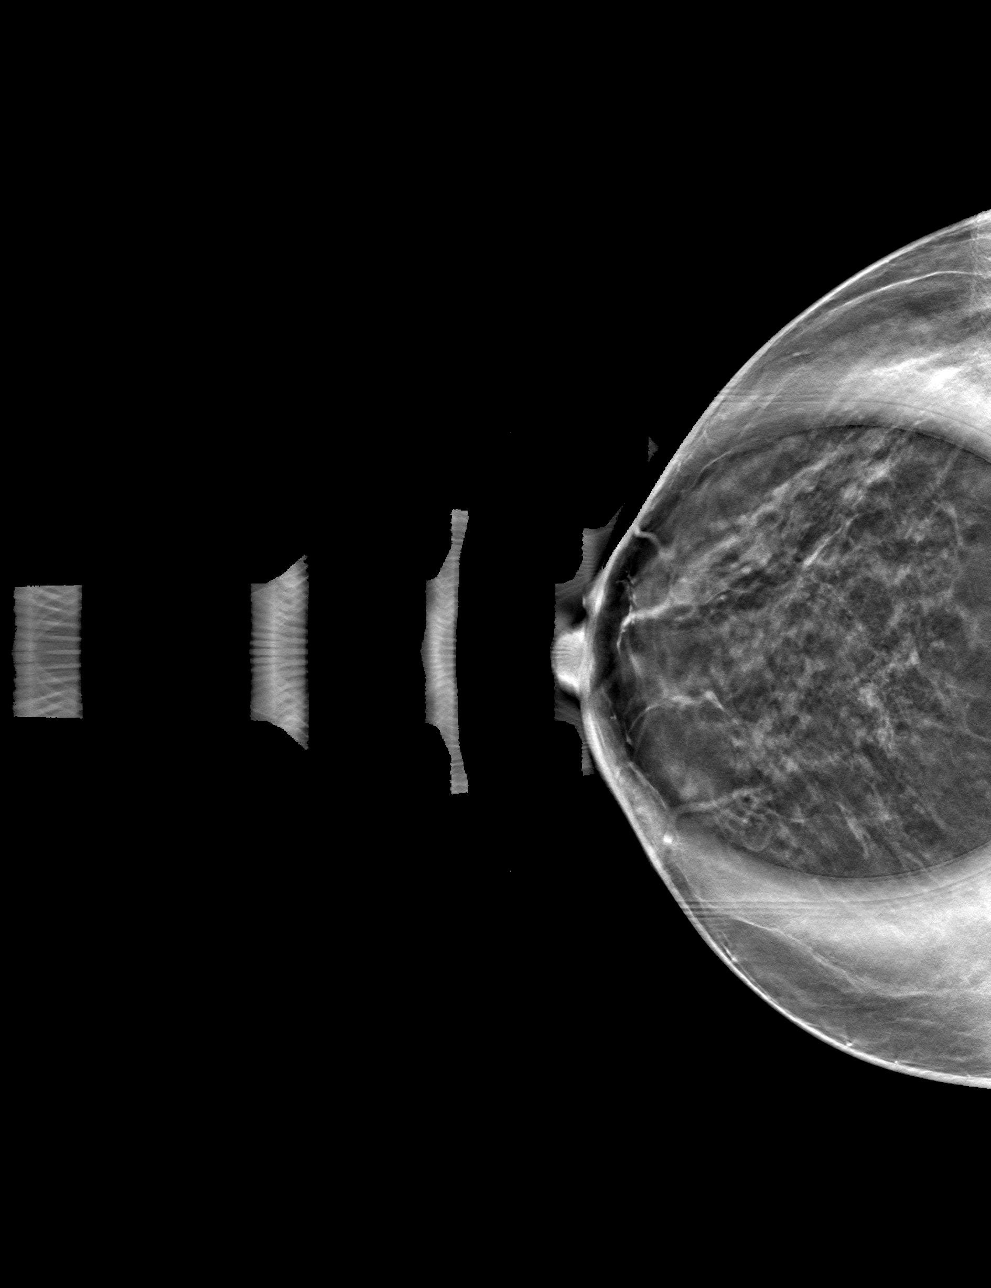

[R MLO tomo · tomo slice 25/50.0]
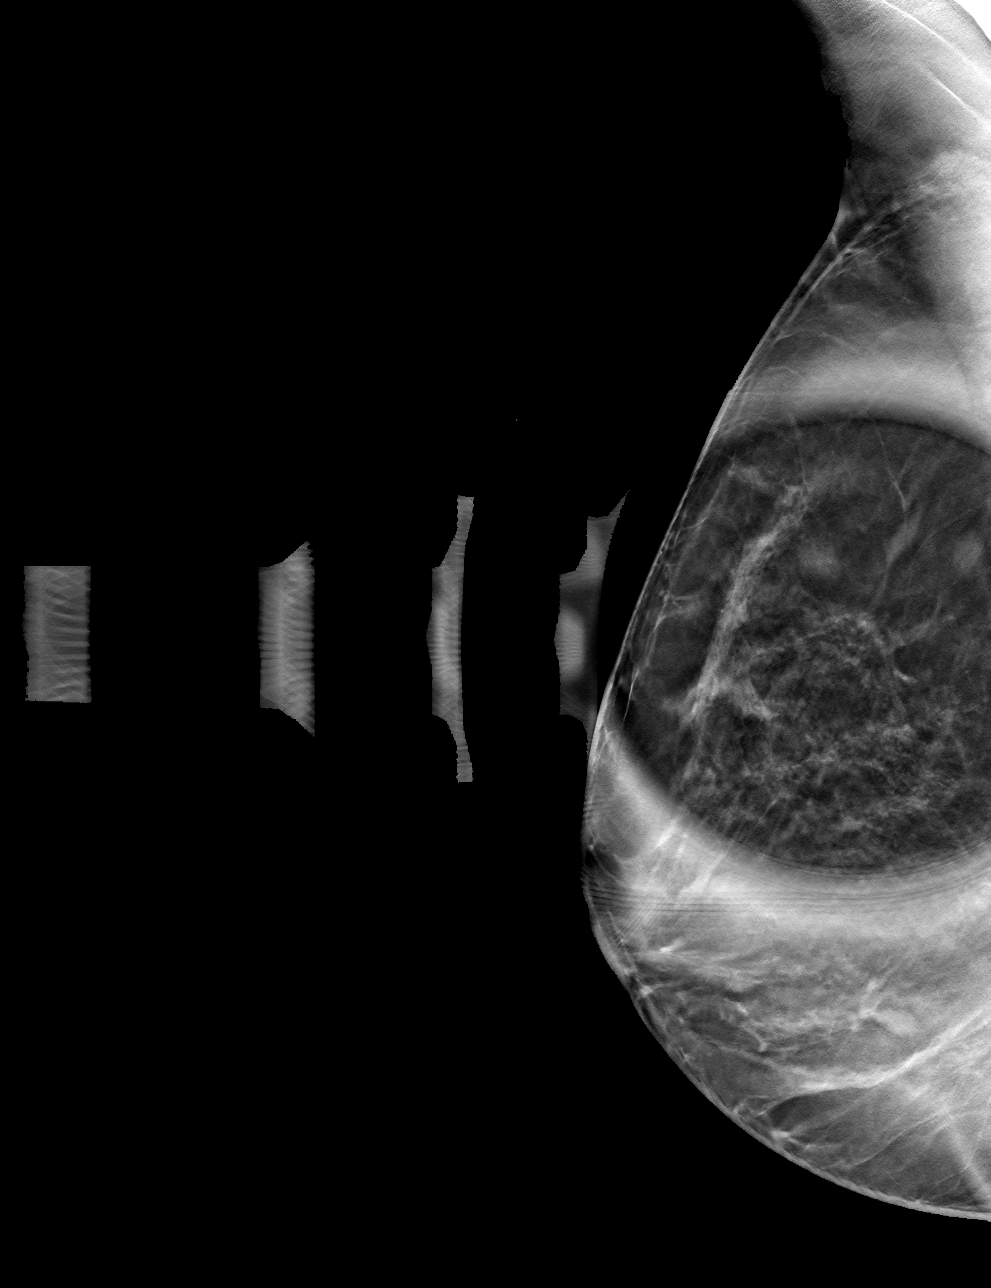

[4 of 12 positions shown; findings below may reference images not displayed]

ACR Breast Density Category c: The breast tissue is heterogeneously
dense, which may obscure small masses.
FINDINGS: RIGHT breast diagnostic mammogram: On today's additional diagnostic
views, including spot compression with 3D tomosynthesis, an oval
circumscribed mass is confirmed within the upper RIGHT breast, 10-12
o'clock axis region, at posterior depth, measuring approximately 5
mm greatest dimension.

Targeted ultrasound is performed, evaluating the entire upper RIGHT
breast, showing 2 benign cysts, 10 o'clock and 11 o'clock axes
respectively, both measuring 5 mm greatest dimension, corresponding
to the mammographic findings. No suspicious solid or cystic mass is
identified by ultrasound.
IMPRESSION: No evidence of malignancy. Benign cysts within the upper RIGHT
breast, largest measuring 5 mm, corresponding to the mammographic
findings.

Patient may return to routine annual bilateral screening mammogram
schedule.

RECOMMENDATION:
Screening mammogram in one year.(Code:SO-F-G77)

I have discussed the findings and recommendations with the patient.
If applicable, a reminder letter will be sent to the patient
regarding the next appointment.

BI-RADS CATEGORY  2: Benign.

## 2021-12-24 ENCOUNTER — Ambulatory Visit (HOSPITAL_BASED_OUTPATIENT_CLINIC_OR_DEPARTMENT_OTHER)
Admission: RE | Admit: 2021-12-24 | Discharge: 2021-12-24 | Disposition: A | Discharge status: Home / Self Care | Payer: Managed Care, Other (non HMO) | Source: Ambulatory Visit | Attending: Family | Admitting: Family

## 2021-12-24 ENCOUNTER — Ambulatory Visit: Payer: Managed Care, Other (non HMO) | Admitting: Family

## 2021-12-24 ENCOUNTER — Telehealth: Payer: Self-pay | Admitting: Family

## 2021-12-24 ENCOUNTER — Encounter: Payer: Self-pay | Admitting: Family

## 2021-12-24 VITALS — BP 127/89 | HR 72 | Temp 97.7°F | Resp 16 | Wt 118.0 lb

## 2021-12-24 DIAGNOSIS — R319 Hematuria, unspecified: Secondary | ICD-10-CM | POA: Diagnosis present

## 2021-12-24 LAB — BASIC METABOLIC PANEL
BUN: 16 mg/dL (ref 6–23)
CO2: 28 mEq/L (ref 19–32)
Calcium: 8.9 mg/dL (ref 8.4–10.5)
Chloride: 106 mEq/L (ref 96–112)
Creatinine, Ser: 0.92 mg/dL (ref 0.40–1.20)
GFR: 69.71 mL/min (ref >60.00)
Glucose, Bld: 85 mg/dL (ref 70–99)
Potassium: 4 mEq/L (ref 3.5–5.1)
Sodium: 140 mEq/L (ref 135–145)

## 2021-12-24 LAB — CBC WITH DIFFERENTIAL/PLATELET
Basophils Absolute: 0 10*3/uL (ref 0.0–0.1)
Basophils Relative: 0.7 % (ref 0.0–3.0)
Eosinophils Absolute: 0.2 10*3/uL (ref 0.0–0.7)
Eosinophils Relative: 3.4 % (ref 0.0–5.0)
HCT: 38.5 % (ref 36.0–46.0)
Hemoglobin: 12.8 g/dL (ref 12.0–15.0)
Lymphocytes Relative: 45.4 % (ref 12.0–46.0)
Lymphs Abs: 2.2 10*3/uL (ref 0.7–4.0)
MCHC: 33.2 g/dL (ref 30.0–36.0)
MCV: 89.2 fl (ref 78.0–100.0)
Monocytes Absolute: 0.4 10*3/uL (ref 0.1–1.0)
Monocytes Relative: 8.2 % (ref 3.0–12.0)
Neutro Abs: 2.1 10*3/uL (ref 1.4–7.7)
Neutrophils Relative %: 42.3 % — ABNORMAL LOW (ref 43.0–77.0)
Platelets: 231 10*3/uL (ref 150.0–400.0)
RBC: 4.31 Mil/uL (ref 3.87–5.11)
RDW: 14.3 % (ref 11.5–15.5)
WBC: 4.9 10*3/uL (ref 4.0–10.5)

## 2021-12-24 LAB — POC URINALSYSI DIPSTICK (AUTOMATED)
Glucose, UA: NEGATIVE
Ketones, UA: NEGATIVE
Leukocytes, UA: NEGATIVE
Nitrite, UA: NEGATIVE
Protein, UA: NEGATIVE
Spec Grav, UA: >=1.03 — AB (ref 1.010–1.025)
Urobilinogen, UA: 0.2 E.U./dL
pH, UA: 6 (ref 5.0–8.0)

## 2021-12-24 MED ORDER — CEPHALEXIN 500 MG PO CAPS: 500.0000 mg | ORAL_CAPSULE | Freq: Four times a day (QID) | ORAL | 0 refills | Status: DC

## 2021-12-24 NOTE — Assessment & Plan Note (Addendum)
Concern for nephrolithiasis vs. Pyelonephritis. Will send urine for cuture.  Begin empiric keflex.  Obtain stat KUB to evaluate for stone.  Labs as below.

## 2021-12-24 NOTE — Telephone Encounter (Signed)
See mychart.  
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
## 2021-12-25 LAB — URINE CULTURE
MICRO NUMBER:: 14272117
Result:: NO GROWTH
SPECIMEN QUALITY:: ADEQUATE

## 2021-12-26 ENCOUNTER — Telehealth: Payer: Self-pay | Admitting: Family

## 2021-12-26 NOTE — Telephone Encounter (Signed)
LMOM informing of results and recommendations. Asked that she let us know how abd/back pain is. Instructed her to call office to schedule 1 week f/u.

## 2021-12-26 NOTE — Telephone Encounter (Signed)
Please advise pt that urine culture was negative.  She can stop antibiotics.  How is her abdominal pain/back pain? Has she seen any further blood? I would like to see her back in 1 week please.

## 2021-12-30 NOTE — Telephone Encounter (Signed)
Spoke with patient and she stated that she is having back pain and hip pain.  I scheduled for for this Friday at 7am

## 2022-01-03 ENCOUNTER — Telehealth: Payer: Self-pay | Admitting: Family

## 2022-01-03 ENCOUNTER — Ambulatory Visit (INDEPENDENT_AMBULATORY_CARE_PROVIDER_SITE_OTHER): Payer: Managed Care, Other (non HMO) | Admitting: Family

## 2022-01-03 ENCOUNTER — Encounter: Payer: Self-pay | Admitting: Family

## 2022-01-03 VITALS — BP 138/82 | HR 57 | Temp 98.3°F | Resp 16 | Ht 66.0 in | Wt 119.1 lb

## 2022-01-03 DIAGNOSIS — Z1211 Encounter for screening for malignant neoplasm of colon: Secondary | ICD-10-CM

## 2022-01-03 DIAGNOSIS — R232 Flushing: Secondary | ICD-10-CM | POA: Insufficient documentation

## 2022-01-03 DIAGNOSIS — R319 Hematuria, unspecified: Secondary | ICD-10-CM

## 2022-01-03 DIAGNOSIS — R3129 Other microscopic hematuria: Secondary | ICD-10-CM

## 2022-01-03 DIAGNOSIS — B3731 Acute candidiasis of vulva and vagina: Secondary | ICD-10-CM | POA: Insufficient documentation

## 2022-01-03 LAB — URINALYSIS, ROUTINE W REFLEX MICROSCOPIC
Bilirubin Urine: NEGATIVE
Ketones, ur: NEGATIVE
Leukocytes,Ua: NEGATIVE
Nitrite: NEGATIVE
Specific Gravity, Urine: >=1.03 — AB (ref 1.000–1.030)
Total Protein, Urine: NEGATIVE
Urine Glucose: NEGATIVE
Urobilinogen, UA: 0.2 (ref 0.0–1.0)
pH: 5.5 (ref 5.0–8.0)

## 2022-01-03 MED ORDER — FLUCONAZOLE 150 MG PO TABS: ORAL_TABLET | ORAL | 0 refills | Status: DC

## 2022-01-03 NOTE — Assessment & Plan Note (Signed)
Gross hematuria is resolved.  Suspect she may have passed a kidney stone.  Will repeat UA today. If persistent microscopic hematuria, we will refer her to urology.

## 2022-01-03 NOTE — Assessment & Plan Note (Signed)
New. Rx sent for diflucan.

## 2022-01-03 NOTE — Telephone Encounter (Signed)
Pt aware and voices understanding.   

## 2022-01-03 NOTE — Telephone Encounter (Signed)
There is still some persistent microscopic blood in her urine. I would like to refer her to Urology for further evaluation.
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
## 2022-01-03 NOTE — Assessment & Plan Note (Signed)
We discussed trial of citalopram.  She declines.  Will monitor.

## 2022-03-11 ENCOUNTER — Other Ambulatory Visit (HOSPITAL_COMMUNITY)
Admission: RE | Admit: 2022-03-11 | Discharge: 2022-03-11 | Disposition: A | Discharge status: Home / Self Care | Payer: Managed Care, Other (non HMO) | Source: Ambulatory Visit | Attending: Family | Admitting: Family

## 2022-03-11 ENCOUNTER — Encounter: Payer: Self-pay | Admitting: Family

## 2022-03-11 ENCOUNTER — Telehealth: Payer: Self-pay | Admitting: Family

## 2022-03-11 ENCOUNTER — Ambulatory Visit (INDEPENDENT_AMBULATORY_CARE_PROVIDER_SITE_OTHER): Payer: Managed Care, Other (non HMO) | Admitting: Family

## 2022-03-11 VITALS — BP 148/90 | HR 69 | Temp 98.0°F | Resp 16 | Wt 121.0 lb

## 2022-03-11 DIAGNOSIS — N898 Other specified noninflammatory disorders of vagina: Secondary | ICD-10-CM

## 2022-03-11 DIAGNOSIS — I10 Essential (primary) hypertension: Secondary | ICD-10-CM | POA: Diagnosis not present

## 2022-03-11 MED ORDER — METOPROLOL SUCCINATE ER 50 MG PO TB24: 50.0000 mg | ORAL_TABLET | Freq: Every day | ORAL | 1 refills | Status: DC

## 2022-03-11 NOTE — Assessment & Plan Note (Signed)
New.  Swab will be sent for bv/yeast/trich/GC/Chlamydia.

## 2022-03-11 NOTE — Telephone Encounter (Signed)
Patient reports she was off the medication for some time and restarted taking recently but, she did not take it yesterday or today. She was advised per pcp, to resume daily, new rx sent. She was scheduled to come back in 2 weeks for nv bp check

## 2022-03-11 NOTE — Telephone Encounter (Signed)
I noticed her elevated blood pressure reading after she left. Has she been taking her metoprolol?  It looks like she hasn't filled it in a while.  Please restart and then schedule a follow up visit in 1 month. I sent a refill.
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
## 2022-03-11 NOTE — Assessment & Plan Note (Signed)
Uncontrolled. Restart toprol xl.

## 2022-03-12 ENCOUNTER — Telehealth: Payer: Self-pay | Admitting: Family

## 2022-03-12 LAB — CERVICOVAGINAL ANCILLARY ONLY
Bacterial Vaginitis (gardnerella): POSITIVE — AB
Candida Glabrata: NEGATIVE
Candida Vaginitis: POSITIVE — AB
Chlamydia: NEGATIVE
Comment: NEGATIVE
Comment: NEGATIVE
Comment: NEGATIVE
Comment: NEGATIVE
Comment: NEGATIVE
Comment: NORMAL
Neisseria Gonorrhea: NEGATIVE
Trichomonas: POSITIVE — AB

## 2022-03-12 NOTE — Telephone Encounter (Signed)
Please advise pt that her swab shows yeast infection, bacterial vaginosis and trichomonas.  Trichomonas is sexually transmitted so partner(s) should also be treated.    I would like her to take diflucan for yeast infection.  For BV- begin metronidazole vaginal gel nightly for 5 nights.  For Trich- I see that she had nausea and vomiting with oral metronidazole.  Unfortunately, the alternative treatment options are all similar drugs. I would like for her to take a single dose of tindamax instead.  She can pre-medicate with zofran to hopefully prevent nausea/vomiting.  Let me know if she has trouble keeping the Tindamax dose down.

## 2022-03-13 MED ORDER — FLUCONAZOLE 150 MG PO TABS: ORAL_TABLET | ORAL | 0 refills | Status: DC

## 2022-03-13 MED ORDER — ONDANSETRON HCL 4 MG PO TABS: 4.0000 mg | ORAL_TABLET | Freq: Three times a day (TID) | ORAL | 0 refills | Status: DC | PRN

## 2022-03-13 MED ORDER — METRONIDAZOLE 0.75 % VA GEL: 1.0000 | Freq: Every day | VAGINAL | 0 refills | Status: AC

## 2022-03-13 MED ORDER — TINIDAZOLE 500 MG PO TABS: 2.0000 g | ORAL_TABLET | Freq: Once | ORAL | 0 refills | Status: AC

## 2022-03-13 NOTE — Telephone Encounter (Signed)
Called patient but no answer, left voice mail for patinet to call back.

## 2022-03-13 NOTE — Telephone Encounter (Signed)
Spoke with pt she is aware of results and expressed understanding. Pt stated she will go ahead and pick up her medicine an take it.

## 2022-03-25 ENCOUNTER — Ambulatory Visit: Payer: Managed Care, Other (non HMO)

## 2022-07-02 ENCOUNTER — Other Ambulatory Visit: Payer: Self-pay | Admitting: Family

## 2022-07-02 DIAGNOSIS — Z1231 Encounter for screening mammogram for malignant neoplasm of breast: Secondary | ICD-10-CM

## 2022-07-03 ENCOUNTER — Telehealth: Payer: Self-pay | Admitting: Family

## 2022-07-03 NOTE — Telephone Encounter (Signed)
Pt called to advise that [facility] told her she would need an order from her provider for mmg since she is having left breast pain. Please call pt to confirm when order placed.

## 2022-07-04 NOTE — Telephone Encounter (Signed)
Left message advising patient to call for appointment so she can get a breast exam since this is a new problem.

## 2023-05-29 ENCOUNTER — Encounter: Admitting: Family

## 2023-08-07 ENCOUNTER — Ambulatory Visit: Payer: Self-pay

## 2023-08-07 NOTE — Telephone Encounter (Signed)
 FYI Only or Action Required?: FYI only for provider.  Patient was last seen in primary care on 03/11/2022 by Daryl Setter, NP.  Called Nurse Triage reporting Breast Pain.  Symptoms began several weeks ago.  Interventions attempted: Nothing.  Symptoms are: left breast pain stable.  Triage Disposition: See PCP Within 2 Weeks  Patient/caregiver understands and will follow disposition?: Yes                 Copied from CRM [id number]. Topic: Clinical - Red Word Triage >> Aug 07, 2023 11:33 AM Silvana PARAS wrote: Red Word that prompted transfer to Nurse Triage: Pain from fluid build up in left breast. Reason for Disposition  [1] Breast pain AND [2] cause is not known  Answer Assessment - Initial Assessment Questions Patient denies any chest, jaw, neck or left arm pain.  1. SYMPTOM: What's the main symptom you're concerned about?  (e.g., lump, nipple discharge, pain, rash)     Intermittent 3-4/10pain.  2. LOCATION: Where is the pain located?     Left breast, under the left side.  3. ONSET: When did pain  start?     X2-3 weeks.  4. PRIOR HISTORY: Do you have any history of prior problems with your breasts? (e.g., breast cancer, breast implant, fibrocystic breast disease)     She states she has history of her breast filling with fluid and has had to have it drained before.  5. CAUSE: What do you think is causing this symptom?     Fluid build up on breast.  6. OTHER SYMPTOMS: Do you have any other symptoms? (e.g., breast pain, fever, nipple discharge, redness or rash)     Patient denies left breast swelling, redness or rash, fever, warmth.  7. PREGNANCY-BREASTFEEDING: Is there any chance you are pregnant? When was your last menstrual period? Are you breastfeeding?     N/A.  Protocols used: Breast Symptoms-A-AH

## 2023-08-19 ENCOUNTER — Ambulatory Visit (INDEPENDENT_AMBULATORY_CARE_PROVIDER_SITE_OTHER): Admitting: Family

## 2023-08-19 VITALS — BP 133/74 | HR 64 | Temp 98.5°F | Resp 16 | Ht 66.0 in | Wt 128.0 lb

## 2023-08-19 DIAGNOSIS — N644 Mastodynia: Secondary | ICD-10-CM | POA: Insufficient documentation

## 2023-08-19 DIAGNOSIS — I10 Essential (primary) hypertension: Secondary | ICD-10-CM | POA: Diagnosis not present

## 2023-08-19 DIAGNOSIS — Z23 Encounter for immunization: Secondary | ICD-10-CM

## 2023-08-19 DIAGNOSIS — Z1231 Encounter for screening mammogram for malignant neoplasm of breast: Secondary | ICD-10-CM

## 2023-08-19 DIAGNOSIS — Z1211 Encounter for screening for malignant neoplasm of colon: Secondary | ICD-10-CM

## 2023-08-19 MED ORDER — MELOXICAM 7.5 MG PO TABS: 7.5000 mg | ORAL_TABLET | Freq: Every day | ORAL | 0 refills | Status: DC

## 2023-08-19 MED ORDER — METOPROLOL SUCCINATE ER 50 MG PO TB24: 50.0000 mg | ORAL_TABLET | Freq: Every day | ORAL | 1 refills | Status: DC
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
## 2023-08-19 NOTE — Patient Instructions (Signed)
 VISIT SUMMARY:  Today, you were seen for intermittent left breast pain and discussed your blood pressure medication. We also reviewed your general health maintenance needs.  YOUR PLAN:  LEFT BREAST PAIN (MASTODYNIA): You have been experiencing intermittent left breast pain, likely due to nerve irritation from the spine. No lumps or masses were detected. -A routine mammogram has been ordered. -You have been prescribed an anti-inflammatory medication to take once daily as needed for pain.  ESSENTIAL HYPERTENSION: You ran out of your blood pressure medication, Toprol  XL (metoprolol ), but your blood pressure is well-controlled today. -A refill for metoprolol  has been sent to [facility].  GENERAL HEALTH MAINTENANCE: You are due for a colonoscopy and a shingles vaccination. Your last mammogram was in 2022. -A colonoscopy has been ordered through [facility]. -You will receive a shingles vaccination.

## 2023-08-19 NOTE — Assessment & Plan Note (Signed)
 BP looks ok today, but will still go ahead and refill her toprol  xl.  BP Readings from Last 3 Encounters:  08/19/23 133/74  03/11/22 (!) 148/90  01/03/22 138/82

## 2023-08-19 NOTE — Assessment & Plan Note (Signed)
 I think that it is related to some mild thoracic radiculopathy.  Doubt breast etiology. Trial of prn meloxicam .

## 2023-08-20 DIAGNOSIS — Z23 Encounter for immunization: Secondary | ICD-10-CM | POA: Diagnosis not present

## 2023-08-24 ENCOUNTER — Ambulatory Visit (HOSPITAL_BASED_OUTPATIENT_CLINIC_OR_DEPARTMENT_OTHER)
Admission: RE | Admit: 2023-08-24 | Discharge: 2023-08-24 | Disposition: A | Discharge status: Home / Self Care | Source: Ambulatory Visit | Attending: Family | Admitting: Family

## 2023-08-24 ENCOUNTER — Encounter (HOSPITAL_BASED_OUTPATIENT_CLINIC_OR_DEPARTMENT_OTHER): Payer: Self-pay

## 2023-08-24 DIAGNOSIS — Z1231 Encounter for screening mammogram for malignant neoplasm of breast: Secondary | ICD-10-CM | POA: Insufficient documentation

## 2023-08-27 ENCOUNTER — Ambulatory Visit: Payer: Self-pay | Admitting: Family

## 2023-11-03 ENCOUNTER — Encounter: Payer: Self-pay | Admitting: Gastroenterology

## 2023-11-16 ENCOUNTER — Ambulatory Visit

## 2023-11-16 VITALS — Ht 66.0 in | Wt 113.0 lb

## 2023-11-16 DIAGNOSIS — Z1211 Encounter for screening for malignant neoplasm of colon: Secondary | ICD-10-CM

## 2023-11-16 MED ORDER — NA SULFATE-K SULFATE-MG SULF 17.5-3.13-1.6 GM/177ML PO SOLN: 1.0000 | Freq: Once | ORAL | 0 refills | Status: AC

## 2023-11-16 NOTE — Progress Notes (Signed)
 No egg or soy allergy known to patient  No issues known to pt with past sedation with any surgeries or procedures Patient denies ever being told they had issues or difficulty with intubation  No FH of Malignant Hyperthermia Pt is not on diet pills Pt is not on  home 02  Pt is not on blood thinners  Pt has intermittent issues with constipation  No A fib or A flutter Have any cardiac testing pending--No Pt can ambulate  Pt denies use of chewing tobacco Discussed diabetic I weight loss medication holds Discussed NSAID holds Checked BMI Pt instructed to use Singlecare.com or GoodRx for a price reduction on prep  Patient's chart reviewed by Cathlyn Parsons CNRA prior to previsit and patient appropriate for the [facility].  Pre visit completed and red dot placed by patient's name on their procedure day (on provider's schedule).

## 2023-11-17 ENCOUNTER — Encounter: Payer: Self-pay | Admitting: Gastroenterology

## 2023-11-20 ENCOUNTER — Encounter: Admitting: Family

## 2023-11-30 ENCOUNTER — Ambulatory Visit (AMBULATORY_SURGERY_CENTER): Admitting: Gastroenterology

## 2023-11-30 ENCOUNTER — Encounter: Payer: Self-pay | Admitting: Gastroenterology

## 2023-11-30 VITALS — BP 133/76 | HR 62 | Temp 97.6°F | Resp 13 | Ht 66.0 in | Wt 113.0 lb

## 2023-11-30 DIAGNOSIS — D123 Benign neoplasm of transverse colon: Secondary | ICD-10-CM

## 2023-11-30 DIAGNOSIS — D122 Benign neoplasm of ascending colon: Secondary | ICD-10-CM

## 2023-11-30 DIAGNOSIS — Z800 Family history of malignant neoplasm of digestive organs: Secondary | ICD-10-CM | POA: Diagnosis not present

## 2023-11-30 DIAGNOSIS — Z1211 Encounter for screening for malignant neoplasm of colon: Secondary | ICD-10-CM | POA: Diagnosis present

## 2023-11-30 MED ORDER — SODIUM CHLORIDE 0.9 % IV SOLN: 500.0000 mL | Freq: Once | INTRAVENOUS | Status: DC

## 2023-11-30 NOTE — Patient Instructions (Signed)
Resume previous diet. Continue present medications. Await pathology results. Repeat colonoscopy in 5 years for surveillance.  YOU HAD AN ENDOSCOPIC PROCEDURE TODAY AT THE [facility]:   Refer to the procedure report that was given to you for any specific questions about what was found during the examination.  If the procedure report does not answer your questions, please call your gastroenterologist to clarify.  If you requested that your care partner not be given the details of your procedure findings, then the procedure report has been included in a sealed envelope for you to review at your convenience later.  YOU SHOULD EXPECT: Some feelings of bloating in the abdomen. Passage of more gas than usual.  Walking can help get rid of the air that was put into your GI tract during the procedure and reduce the bloating. If you had a lower endoscopy (such as a colonoscopy or flexible sigmoidoscopy) you may notice spotting of blood in your stool or on the toilet paper. If you underwent a bowel prep for your procedure, you may not have a normal bowel movement for a few days.  Please Note:  You might notice some irritation and congestion in your nose or some drainage.  This is from the oxygen used during your procedure.  There is no need for concern and it should clear up in a day or so.  SYMPTOMS TO REPORT IMMEDIATELY:  Following lower endoscopy (colonoscopy or flexible sigmoidoscopy):  Excessive amounts of blood in the stool  Significant tenderness or worsening of abdominal pains  Swelling of the abdomen that is new, acute  Fever of 100F or higher   For urgent or emergent issues, a gastroenterologist can be reached at any hour by calling [phone]. Do not use MyChart messaging for urgent concerns.    DIET:  We do recommend a small meal at first, but then you may proceed to your regular diet.  Drink plenty of fluids but you should avoid alcoholic beverages for 24  hours.  ACTIVITY:  You should plan to take it easy for the rest of today and you should NOT DRIVE or use heavy machinery until tomorrow (because of the sedation medicines used during the test).    FOLLOW UP: Our staff will call the number listed on your records the next business day following your procedure.  We will call around 7:15- 8:00 am to check on you and address any questions or concerns that you may have regarding the information given to you following your procedure. If we do not reach you, we will leave a message.     If any biopsies were taken you will be contacted by phone or by letter within the next 1-3 weeks.  Please call us at [phone] if you have not heard about the biopsies in 3 weeks.    SIGNATURES/CONFIDENTIALITY: You and/or your care partner have signed paperwork which will be entered into your electronic medical record.  These signatures attest to the fact that that the information above on your After Visit Summary has been reviewed and is understood.  Full responsibility of the confidentiality of this discharge information lies with you and/or your care-partner.

## 2023-11-30 NOTE — Progress Notes (Unsigned)
 Pt's states no medical or surgical changes since previsit or office visit.

## 2023-11-30 NOTE — Progress Notes (Unsigned)
 Called to room to assist during endoscopic procedure.  Patient ID and intended procedure confirmed with present staff. Received instructions for my participation in the procedure from the performing physician.
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
## 2023-11-30 NOTE — Progress Notes (Signed)
 Transferred to PACU via stretcher. Patient arousing to stimulation.  VSS upon leaving procedure room.

## 2023-11-30 NOTE — Op Note (Signed)
 [facility] Patient Name: Emily Casey Procedure Date: 11/30/2023 3:03 PM MRN: [id number] Endoscopist: Glendia E. Stacia , MD, [id number] Age: 58 Referring MD:  Date of Birth: 02/01/1965 Gender: Female Account #: [id number] Procedure:                Colonoscopy Indications:              Screening in patient at increased risk: Colorectal                            cancer in father before age 57 Medicines:                Monitored Anesthesia Care Procedure:                Pre-Anesthesia Assessment:                           - Prior to the procedure, a History and Physical                            was performed, and patient medications and                            allergies were reviewed. The patient's tolerance of                            previous anesthesia was also reviewed. The risks                            and benefits of the procedure and the sedation                            options and risks were discussed with the patient.                            All questions were answered, and informed consent                            was obtained. Prior Anticoagulants: The patient has                            taken no anticoagulant or antiplatelet agents. ASA                            Grade Assessment: II - A patient with mild systemic                            disease. After reviewing the risks and benefits,                            the patient was deemed in satisfactory condition to                            undergo the procedure.  After obtaining informed consent, the colonoscope                            was passed under direct vision. Throughout the                            procedure, the patient's blood pressure, pulse, and                            oxygen saturations were monitored continuously. The                            CF [id number] #[id number] was introduced through the anus                            and advanced to  the the terminal ileum, with                            identification of the appendiceal orifice and IC                            valve. The colonoscopy was performed without                            difficulty. The patient tolerated the procedure                            well. The quality of the bowel preparation was                            adequate. The terminal ileum, ileocecal valve,                            appendiceal orifice, and rectum were photographed.                            The bowel preparation used was SUPREP via split                            dose instruction. Scope In: 3:24:48 PM Scope Out: 3:44:58 PM Scope Withdrawal Time: 0 hours 12 minutes 36 seconds  Total Procedure Duration: 0 hours 20 minutes 10 seconds  Findings:                 The perianal and digital rectal examinations were                            normal. Pertinent negatives include normal                            sphincter tone and no palpable rectal lesions.                           A 4 mm polyp was found in the proximal ascending  colon. The polyp was sessile. The polyp was removed                            with a cold snare. Resection and retrieval were                            complete. Estimated blood loss was minimal.                           A 2 mm polyp was found in the hepatic flexure. The                            polyp was sessile. The polyp was removed with a                            cold snare. Resection and retrieval were complete.                            Estimated blood loss was minimal.                           The exam was otherwise normal throughout the                            examined colon.                           The terminal ileum appeared normal.                           The retroflexed view of the distal rectum and anal                            verge was normal and showed no anal or rectal                             abnormalities. Complications:            No immediate complications. Estimated Blood Loss:     Estimated blood loss was minimal. Impression:               - One 4 mm polyp in the proximal ascending colon,                            removed with a cold snare. Resected and retrieved.                           - One 2 mm polyp at the hepatic flexure, removed                            with a cold snare. Resected and retrieved.                           - The examined portion of the ileum was  normal.                           - The distal rectum and anal verge are normal on                            retroflexion view. Recommendation:           - Patient has a contact number available for                            emergencies. The signs and symptoms of potential                            delayed complications were discussed with the                            patient. Return to normal activities tomorrow.                            Written discharge instructions were provided to the                            patient.                           - Resume previous diet.                           - Continue present medications.                           - Await pathology results.                           - Repeat colonoscopy in 5 years for surveillance. Zelia Yzaguirre E. Stacia, MD 11/30/2023 3:50:42 PM This report has been signed electronically.

## 2023-12-01 ENCOUNTER — Telehealth: Payer: Self-pay

## 2023-12-01 NOTE — Telephone Encounter (Signed)
  Follow up Call-     11/30/2023    2:42 PM  Call back number  Post procedure Call Back phone  # [phone]  Permission to leave phone message Yes     Patient questions:  Do you have a fever, pain , or abdominal swelling? No. Pain Score  0 *  Have you tolerated food without any problems? Yes.    Have you been able to return to your normal activities? Yes.    Do you have any questions about your discharge instructions: Diet   No. Medications  No. Follow up visit  No.  Do you have questions or concerns about your Care? No.  Actions: * If pain score is 4 or above: No action needed, pain <4.

## 2023-12-03 LAB — SURGICAL PATHOLOGY

## 2023-12-07 ENCOUNTER — Ambulatory Visit: Payer: Self-pay | Admitting: Gastroenterology

## 2023-12-07 NOTE — Progress Notes (Signed)
 Emily Casey,  The two polyps which I removed during your recent procedure were proven to be completely benign but are considered pre-cancerous polyps that MAY have grown into cancer if they had not been removed.  Studies shows that at least 20% of women over age 58 and 30% of men over age 20 have pre-cancerous polyps.  Based on current nationally recognized surveillance guidelines, I recommend that you have a repeat colonoscopy in 5 years because of your family history of colon cancer in a first degree relative younger than age 93.   If you develop any new rectal bleeding, abdominal pain or significant bowel habit changes, please contact me before then.

## 2023-12-23 ENCOUNTER — Ambulatory Visit: Admitting: Family

## 2023-12-23 ENCOUNTER — Telehealth: Payer: Self-pay | Admitting: Family

## 2023-12-23 ENCOUNTER — Encounter: Payer: Self-pay | Admitting: Family

## 2023-12-23 VITALS — BP 139/88 | HR 70 | Temp 98.0°F | Resp 16 | Ht 64.5 in | Wt 129.0 lb

## 2023-12-23 DIAGNOSIS — R3129 Other microscopic hematuria: Secondary | ICD-10-CM

## 2023-12-23 DIAGNOSIS — N949 Unspecified condition associated with female genital organs and menstrual cycle: Secondary | ICD-10-CM

## 2023-12-23 DIAGNOSIS — N83209 Unspecified ovarian cyst, unspecified side: Secondary | ICD-10-CM | POA: Insufficient documentation

## 2023-12-23 DIAGNOSIS — Z23 Encounter for immunization: Secondary | ICD-10-CM

## 2023-12-23 DIAGNOSIS — Z0000 Encounter for general adult medical examination without abnormal findings: Secondary | ICD-10-CM

## 2023-12-23 DIAGNOSIS — I10 Essential (primary) hypertension: Secondary | ICD-10-CM

## 2023-12-23 DIAGNOSIS — R319 Hematuria, unspecified: Secondary | ICD-10-CM

## 2023-12-23 MED ORDER — METOPROLOL SUCCINATE ER 50 MG PO TB24: 50.0000 mg | ORAL_TABLET | Freq: Every day | ORAL | 1 refills | Status: AC

## 2023-12-23 NOTE — Patient Instructions (Addendum)
  VISIT SUMMARY: Today, you had your annual physical exam. We discussed your overall health, including your occasional cramps, leg swelling, and eye condition. We also reviewed your past medical history and current medications. You received several vaccines and we talked about the importance of regular dental visits, a healthy diet, and exercise.  YOUR PLAN: -ADULT WELLNESS VISIT: This was a routine check-up to assess your overall health. Your Pap smear is due, and you are overdue for a dental visit. Your vision is stable, but we are monitoring the cataract in your left eye. You received flu, pneumonia, and hepatitis B vaccines today. We scheduled a nurse visit for your second hepatitis B vaccine in one month. Please remember to schedule regular dental visits and maintain a healthy diet and exercise routine.  -ESSENTIAL HYPERTENSION: This is high blood pressure. We ordered a metabolic panel to check your kidney function and refilled your metoprolol  prescription. Please follow up in six months for a blood pressure check.  -CATARACT, LEFT EYE (MONOCULAR VISION): A cataract is a clouding of the lens in your eye, which can affect vision. We are monitoring the cataract in your left eye closely.  -ARTHRALGIA: Arthralgia means joint pain. Your intermittent leg pain is likely due to arthritis, similar to the symptoms in your hands.  INSTRUCTIONS: Please schedule a nurse visit in one month for your second hepatitis B vaccine. Follow up in six months for a blood pressure check. Remember to schedule a Pap smear and a dental visit.

## 2023-12-23 NOTE — Telephone Encounter (Signed)
 See my chart message

## 2023-12-23 NOTE — Assessment & Plan Note (Signed)
 She initially saw GYN for this back in 2019 but it appears she did not return for recommended follow up. Will refer her back to GYN.
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
## 2023-12-23 NOTE — Assessment & Plan Note (Signed)
 BP stable on current dose of metoprolol .  Continue same.

## 2023-12-23 NOTE — Assessment & Plan Note (Signed)
 Update urinalysis today.

## 2023-12-23 NOTE — Assessment & Plan Note (Signed)
  Routine wellness visit with stable health status. Pap smear due, dental visit overdue, stable vision with left eye cataract. - Administered flu, pneumonia, and hepatitis B vaccines. - Scheduled nurse visit for second hepatitis B vaccine in one month. - Encouraged regular dental visits. - Discussed healthy diet and exercise.

## 2023-12-24 ENCOUNTER — Ambulatory Visit: Payer: Self-pay | Admitting: Family

## 2023-12-24 DIAGNOSIS — R748 Abnormal levels of other serum enzymes: Secondary | ICD-10-CM

## 2023-12-24 LAB — URINALYSIS, ROUTINE W REFLEX MICROSCOPIC
Bilirubin Urine: NEGATIVE
Hgb urine dipstick: NEGATIVE
Ketones, ur: NEGATIVE
Leukocytes,Ua: NEGATIVE
Nitrite: NEGATIVE
Specific Gravity, Urine: 1.015 (ref 1.000–1.030)
Total Protein, Urine: NEGATIVE
Urine Glucose: NEGATIVE
Urobilinogen, UA: 1 (ref 0.0–1.0)
pH: 7.5 (ref 5.0–8.0)

## 2023-12-24 LAB — COMPREHENSIVE METABOLIC PANEL WITH GFR
ALT: 12 U/L (ref 0–35)
AST: 20 U/L (ref 0–37)
Albumin: 4.3 g/dL (ref 3.5–5.2)
Alkaline Phosphatase: 145 U/L — ABNORMAL HIGH (ref 39–117)
BUN: 11 mg/dL (ref 6–23)
CO2: 30 meq/L (ref 19–32)
Calcium: 9.6 mg/dL (ref 8.4–10.5)
Chloride: 102 meq/L (ref 96–112)
Creatinine, Ser: 0.97 mg/dL (ref 0.40–1.20)
GFR: 64.51 mL/min (ref >60.00)
Glucose, Bld: 74 mg/dL (ref 70–99)
Potassium: 4.2 meq/L (ref 3.5–5.1)
Sodium: 138 meq/L (ref 135–145)
Total Bilirubin: 0.4 mg/dL (ref 0.2–1.2)
Total Protein: 7.5 g/dL (ref 6.0–8.3)

## 2023-12-24 NOTE — Telephone Encounter (Signed)
 Alk phosphatase, an enzyme in her blood is mildly elevated.  This is usually not worrisome, but I would like to repeat lab work in 3 weeks.

## 2023-12-24 NOTE — Telephone Encounter (Signed)
 Patient advised of results and scheduled to return 12/29 for alk phosphatase

## 2024-01-07 NOTE — Addendum Note (Signed)
 Addended by: ESTELLE GILLIS D on: 01/07/2024 10:02 AM   Modules accepted: Orders

## 2024-01-18 ENCOUNTER — Other Ambulatory Visit (INDEPENDENT_AMBULATORY_CARE_PROVIDER_SITE_OTHER)

## 2024-01-18 DIAGNOSIS — R748 Abnormal levels of other serum enzymes: Secondary | ICD-10-CM

## 2024-01-20 LAB — ALKALINE PHOSPHATASE ISOENZYMES
Alkaline phosphatase (APISO): 122 U/L (ref 37–153)
Bone Isoenzymes: 59 % (ref 28–66)
Intestinal Isoenzymes: 0 % — ABNORMAL LOW (ref 1–24)
Liver Isoenzymes: 41 % (ref 25–69)
Macrohepatic isoenzymes: 0 %
Placental isoenzymes: 0 %

## 2024-01-25 ENCOUNTER — Ambulatory Visit: Payer: Self-pay | Admitting: Family
